# Patient Record
Sex: Male | Born: 1940
Health system: Southern US, Community
[De-identification: ages and names within clinical notes are randomized; demographics above are authoritative.]

## PROBLEM LIST (undated history)

## (undated) DIAGNOSIS — I499 Cardiac arrhythmia, unspecified: Secondary | ICD-10-CM

## (undated) DIAGNOSIS — E7800 Pure hypercholesterolemia, unspecified: Secondary | ICD-10-CM

## (undated) DIAGNOSIS — M1990 Unspecified osteoarthritis, unspecified site: Secondary | ICD-10-CM

## (undated) DIAGNOSIS — K219 Gastro-esophageal reflux disease without esophagitis: Secondary | ICD-10-CM

## (undated) DIAGNOSIS — Z9289 Personal history of other medical treatment: Secondary | ICD-10-CM

## (undated) DIAGNOSIS — I10 Essential (primary) hypertension: Secondary | ICD-10-CM

## (undated) DIAGNOSIS — F32A Depression, unspecified: Secondary | ICD-10-CM

## (undated) DIAGNOSIS — J449 Chronic obstructive pulmonary disease, unspecified: Secondary | ICD-10-CM

## (undated) DIAGNOSIS — K625 Hemorrhage of anus and rectum: Secondary | ICD-10-CM

## (undated) DIAGNOSIS — E785 Hyperlipidemia, unspecified: Secondary | ICD-10-CM

## (undated) DIAGNOSIS — I480 Paroxysmal atrial fibrillation: Secondary | ICD-10-CM

## (undated) DIAGNOSIS — N183 Chronic kidney disease, stage 3 unspecified: Secondary | ICD-10-CM

## (undated) DIAGNOSIS — C61 Malignant neoplasm of prostate: Secondary | ICD-10-CM

## (undated) DIAGNOSIS — I8393 Asymptomatic varicose veins of bilateral lower extremities: Secondary | ICD-10-CM

## (undated) DIAGNOSIS — K5120 Ulcerative (chronic) proctitis without complications: Secondary | ICD-10-CM

## (undated) HISTORY — DX: Malignant neoplasm of prostate: C61

## (undated) HISTORY — PX: PROSTATECTOMY: SHX69

## (undated) HISTORY — DX: Essential (primary) hypertension: I10

## (undated) HISTORY — DX: Hyperlipidemia, unspecified: E78.5

## (undated) HISTORY — DX: Depression, unspecified: F32.A

## (undated) HISTORY — DX: Paroxysmal atrial fibrillation: I48.0

## (undated) HISTORY — DX: Asymptomatic varicose veins of bilateral lower extremities: I83.93

## (undated) HISTORY — DX: Chronic obstructive pulmonary disease, unspecified: J44.9

## (undated) HISTORY — DX: Chronic kidney disease, stage 3 unspecified: N18.30

## (undated) HISTORY — DX: Hemorrhage of anus and rectum: K62.5

## (undated) HISTORY — DX: Ulcerative (chronic) proctitis without complications: K51.20

## (undated) HISTORY — DX: Pure hypercholesterolemia, unspecified: E78.00

## (undated) HISTORY — PX: GASTRECTOMY: SHX58

## (undated) HISTORY — DX: Personal history of other medical treatment: Z92.89

---

## 2001-08-13 ENCOUNTER — Ambulatory Visit (HOSPITAL_COMMUNITY): Admission: RE | Admit: 2001-08-13 | Discharge: 2001-08-13 | Payer: Self-pay | Admitting: Family Medicine

## 2001-08-13 ENCOUNTER — Encounter: Payer: Self-pay | Admitting: Family Medicine

## 2014-11-15 ENCOUNTER — Encounter (INDEPENDENT_AMBULATORY_CARE_PROVIDER_SITE_OTHER): Payer: Self-pay

## 2014-11-15 ENCOUNTER — Encounter (INDEPENDENT_AMBULATORY_CARE_PROVIDER_SITE_OTHER): Payer: Self-pay | Admitting: *Deleted

## 2016-03-08 DIAGNOSIS — I10 Essential (primary) hypertension: Secondary | ICD-10-CM | POA: Diagnosis not present

## 2016-03-08 DIAGNOSIS — Z6835 Body mass index (BMI) 35.0-35.9, adult: Secondary | ICD-10-CM | POA: Diagnosis not present

## 2016-03-08 DIAGNOSIS — Z1389 Encounter for screening for other disorder: Secondary | ICD-10-CM | POA: Diagnosis not present

## 2016-03-08 DIAGNOSIS — F329 Major depressive disorder, single episode, unspecified: Secondary | ICD-10-CM | POA: Diagnosis not present

## 2016-03-08 DIAGNOSIS — Z299 Encounter for prophylactic measures, unspecified: Secondary | ICD-10-CM | POA: Diagnosis not present

## 2016-03-29 DIAGNOSIS — J019 Acute sinusitis, unspecified: Secondary | ICD-10-CM | POA: Diagnosis not present

## 2016-03-29 DIAGNOSIS — I10 Essential (primary) hypertension: Secondary | ICD-10-CM | POA: Diagnosis not present

## 2016-03-29 DIAGNOSIS — F329 Major depressive disorder, single episode, unspecified: Secondary | ICD-10-CM | POA: Diagnosis not present

## 2016-03-29 DIAGNOSIS — Z87891 Personal history of nicotine dependence: Secondary | ICD-10-CM | POA: Diagnosis not present

## 2016-03-29 DIAGNOSIS — J449 Chronic obstructive pulmonary disease, unspecified: Secondary | ICD-10-CM | POA: Diagnosis not present

## 2016-03-29 DIAGNOSIS — Z299 Encounter for prophylactic measures, unspecified: Secondary | ICD-10-CM | POA: Diagnosis not present

## 2016-05-21 DIAGNOSIS — R5383 Other fatigue: Secondary | ICD-10-CM | POA: Diagnosis not present

## 2016-05-21 DIAGNOSIS — Z1211 Encounter for screening for malignant neoplasm of colon: Secondary | ICD-10-CM | POA: Diagnosis not present

## 2016-05-21 DIAGNOSIS — Z7189 Other specified counseling: Secondary | ICD-10-CM | POA: Diagnosis not present

## 2016-05-21 DIAGNOSIS — Z79899 Other long term (current) drug therapy: Secondary | ICD-10-CM | POA: Diagnosis not present

## 2016-05-21 DIAGNOSIS — Z1389 Encounter for screening for other disorder: Secondary | ICD-10-CM | POA: Diagnosis not present

## 2016-05-21 DIAGNOSIS — E7800 Pure hypercholesterolemia, unspecified: Secondary | ICD-10-CM | POA: Diagnosis not present

## 2016-05-21 DIAGNOSIS — Z0000 Encounter for general adult medical examination without abnormal findings: Secondary | ICD-10-CM | POA: Diagnosis not present

## 2016-05-21 DIAGNOSIS — Z125 Encounter for screening for malignant neoplasm of prostate: Secondary | ICD-10-CM | POA: Diagnosis not present

## 2016-05-21 DIAGNOSIS — Z299 Encounter for prophylactic measures, unspecified: Secondary | ICD-10-CM | POA: Diagnosis not present

## 2016-06-07 DIAGNOSIS — Z23 Encounter for immunization: Secondary | ICD-10-CM | POA: Diagnosis not present

## 2016-07-19 DIAGNOSIS — F329 Major depressive disorder, single episode, unspecified: Secondary | ICD-10-CM | POA: Diagnosis not present

## 2016-07-19 DIAGNOSIS — I10 Essential (primary) hypertension: Secondary | ICD-10-CM | POA: Diagnosis not present

## 2016-08-19 DIAGNOSIS — F329 Major depressive disorder, single episode, unspecified: Secondary | ICD-10-CM | POA: Diagnosis not present

## 2016-08-19 DIAGNOSIS — I10 Essential (primary) hypertension: Secondary | ICD-10-CM | POA: Diagnosis not present

## 2016-08-21 DIAGNOSIS — M170 Bilateral primary osteoarthritis of knee: Secondary | ICD-10-CM | POA: Diagnosis not present

## 2016-08-21 DIAGNOSIS — J449 Chronic obstructive pulmonary disease, unspecified: Secondary | ICD-10-CM | POA: Diagnosis not present

## 2016-08-21 DIAGNOSIS — I10 Essential (primary) hypertension: Secondary | ICD-10-CM | POA: Diagnosis not present

## 2016-08-21 DIAGNOSIS — Z6836 Body mass index (BMI) 36.0-36.9, adult: Secondary | ICD-10-CM | POA: Diagnosis not present

## 2016-08-21 DIAGNOSIS — Z299 Encounter for prophylactic measures, unspecified: Secondary | ICD-10-CM | POA: Diagnosis not present

## 2016-10-17 DIAGNOSIS — F329 Major depressive disorder, single episode, unspecified: Secondary | ICD-10-CM | POA: Diagnosis not present

## 2016-10-17 DIAGNOSIS — I10 Essential (primary) hypertension: Secondary | ICD-10-CM | POA: Diagnosis not present

## 2016-11-19 DIAGNOSIS — F329 Major depressive disorder, single episode, unspecified: Secondary | ICD-10-CM | POA: Diagnosis not present

## 2016-11-19 DIAGNOSIS — I10 Essential (primary) hypertension: Secondary | ICD-10-CM | POA: Diagnosis not present

## 2016-11-21 DIAGNOSIS — Z87891 Personal history of nicotine dependence: Secondary | ICD-10-CM | POA: Diagnosis not present

## 2016-11-21 DIAGNOSIS — I10 Essential (primary) hypertension: Secondary | ICD-10-CM | POA: Diagnosis not present

## 2016-11-21 DIAGNOSIS — J449 Chronic obstructive pulmonary disease, unspecified: Secondary | ICD-10-CM | POA: Diagnosis not present

## 2016-11-21 DIAGNOSIS — M25569 Pain in unspecified knee: Secondary | ICD-10-CM | POA: Diagnosis not present

## 2016-11-21 DIAGNOSIS — I480 Paroxysmal atrial fibrillation: Secondary | ICD-10-CM | POA: Diagnosis not present

## 2016-11-21 DIAGNOSIS — Z299 Encounter for prophylactic measures, unspecified: Secondary | ICD-10-CM | POA: Diagnosis not present

## 2016-11-21 DIAGNOSIS — Z713 Dietary counseling and surveillance: Secondary | ICD-10-CM | POA: Diagnosis not present

## 2016-11-21 DIAGNOSIS — Z6836 Body mass index (BMI) 36.0-36.9, adult: Secondary | ICD-10-CM | POA: Diagnosis not present

## 2016-11-29 DIAGNOSIS — H353131 Nonexudative age-related macular degeneration, bilateral, early dry stage: Secondary | ICD-10-CM | POA: Diagnosis not present

## 2016-11-29 DIAGNOSIS — H25812 Combined forms of age-related cataract, left eye: Secondary | ICD-10-CM | POA: Diagnosis not present

## 2016-11-29 DIAGNOSIS — H25811 Combined forms of age-related cataract, right eye: Secondary | ICD-10-CM | POA: Diagnosis not present

## 2016-11-29 DIAGNOSIS — H02831 Dermatochalasis of right upper eyelid: Secondary | ICD-10-CM | POA: Diagnosis not present

## 2016-12-05 DIAGNOSIS — H25812 Combined forms of age-related cataract, left eye: Secondary | ICD-10-CM | POA: Diagnosis not present

## 2016-12-05 DIAGNOSIS — H2512 Age-related nuclear cataract, left eye: Secondary | ICD-10-CM | POA: Diagnosis not present

## 2016-12-26 DIAGNOSIS — H25811 Combined forms of age-related cataract, right eye: Secondary | ICD-10-CM | POA: Diagnosis not present

## 2017-01-20 DIAGNOSIS — I10 Essential (primary) hypertension: Secondary | ICD-10-CM | POA: Diagnosis not present

## 2017-01-20 DIAGNOSIS — F329 Major depressive disorder, single episode, unspecified: Secondary | ICD-10-CM | POA: Diagnosis not present

## 2017-02-18 DIAGNOSIS — F329 Major depressive disorder, single episode, unspecified: Secondary | ICD-10-CM | POA: Diagnosis not present

## 2017-02-18 DIAGNOSIS — I10 Essential (primary) hypertension: Secondary | ICD-10-CM | POA: Diagnosis not present

## 2017-03-21 DIAGNOSIS — Z713 Dietary counseling and surveillance: Secondary | ICD-10-CM | POA: Diagnosis not present

## 2017-03-21 DIAGNOSIS — Z6836 Body mass index (BMI) 36.0-36.9, adult: Secondary | ICD-10-CM | POA: Diagnosis not present

## 2017-03-21 DIAGNOSIS — Z299 Encounter for prophylactic measures, unspecified: Secondary | ICD-10-CM | POA: Diagnosis not present

## 2017-03-21 DIAGNOSIS — K625 Hemorrhage of anus and rectum: Secondary | ICD-10-CM | POA: Diagnosis not present

## 2017-03-27 ENCOUNTER — Encounter (INDEPENDENT_AMBULATORY_CARE_PROVIDER_SITE_OTHER): Payer: Self-pay | Admitting: Internal Medicine

## 2017-04-01 ENCOUNTER — Ambulatory Visit (INDEPENDENT_AMBULATORY_CARE_PROVIDER_SITE_OTHER): Payer: Medicare Other | Admitting: Internal Medicine

## 2017-04-01 ENCOUNTER — Other Ambulatory Visit (INDEPENDENT_AMBULATORY_CARE_PROVIDER_SITE_OTHER): Payer: Self-pay | Admitting: Internal Medicine

## 2017-04-01 ENCOUNTER — Encounter (INDEPENDENT_AMBULATORY_CARE_PROVIDER_SITE_OTHER): Payer: Self-pay | Admitting: *Deleted

## 2017-04-01 ENCOUNTER — Encounter (INDEPENDENT_AMBULATORY_CARE_PROVIDER_SITE_OTHER): Payer: Self-pay | Admitting: Internal Medicine

## 2017-04-01 ENCOUNTER — Encounter (INDEPENDENT_AMBULATORY_CARE_PROVIDER_SITE_OTHER): Payer: Self-pay

## 2017-04-01 ENCOUNTER — Telehealth (INDEPENDENT_AMBULATORY_CARE_PROVIDER_SITE_OTHER): Payer: Self-pay | Admitting: *Deleted

## 2017-04-01 DIAGNOSIS — K625 Hemorrhage of anus and rectum: Secondary | ICD-10-CM

## 2017-04-01 DIAGNOSIS — I10 Essential (primary) hypertension: Secondary | ICD-10-CM

## 2017-04-01 DIAGNOSIS — E7800 Pure hypercholesterolemia, unspecified: Secondary | ICD-10-CM

## 2017-04-01 DIAGNOSIS — K5120 Ulcerative (chronic) proctitis without complications: Secondary | ICD-10-CM

## 2017-04-01 HISTORY — DX: Essential (primary) hypertension: I10

## 2017-04-01 HISTORY — DX: Pure hypercholesterolemia, unspecified: E78.00

## 2017-04-01 HISTORY — DX: Hemorrhage of anus and rectum: K62.5

## 2017-04-01 HISTORY — DX: Ulcerative (chronic) proctitis without complications: K51.20

## 2017-04-01 MED ORDER — PEG 3350-KCL-NA BICARB-NACL 420 G PO SOLR: 4000.0000 mL | Freq: Once | ORAL | 0 refills | Status: AC

## 2017-04-01 NOTE — Telephone Encounter (Signed)
Patient needs trilyte 

## 2017-04-01 NOTE — Progress Notes (Addendum)
Subjective:    Patient ID: Marvin Lawson, male    DOB: Nov 25, 1940, 76 y.o.   MRN: [id number]  HPI  Referred by Dr. Sherryll Burger for rectal bleeding. States he see blood in the commode. Turns the water in the commode red. He states he sees blood every day. He has some pain across his mid abdomen.  His stools are normal. Has a BM x 2 every morning. BMs are dark brown in color and sometimes they are red.  Has had rectal bleeding x 3 weeks. Hx of paroxysmal and maintained on Xarelto Hx of UC.  03/21/2017 Hand H 13.7 and 40.9  Last colonoscopy in 2011 (Surveillance). Hx of UC. Has been in remission for several years No evidence of active colitis. 6-7 mm polyp snared from rectum.Smaol polyp at rectosigmoid junction ablated via cold biopsy and 3 tiny polyps in the rectum were coagulated. A few tiny diverticula at sigmoid and external hemorrhoids. Biopsy Tubular adenoma and Hyperplastic polyp.      Review of Systems    Past Medical History:  Diagnosis Date  . Essential hypertension, benign 04/01/2017  . High cholesterol 04/01/2017  . Rectal bleeding 04/01/2017  . UC (ulcerative colitis confined to rectum) ([facility]) 04/01/2017   Current Outpatient Prescriptions  Medication Sig Dispense Refill  . atorvastatin (LIPITOR) 10 MG tablet Take 10 mg by mouth daily.    . diclofenac sodium (VOLTAREN) 1 % GEL Apply topically 4 (four) times daily.    Marland Kitchen diltiazem (TIAZAC) 180 MG 24 hr capsule Take 180 mg by mouth daily.    Marland Kitchen lisinopril-hydrochlorothiazide (PRINZIDE,ZESTORETIC) 20-12.5 MG tablet Take 1 tablet by mouth daily.    Marland Kitchen loratadine (CLARITIN) 10 MG tablet Take 10 mg by mouth daily.    . mirtazapine (REMERON) 30 MG tablet Take 30 mg by mouth at bedtime.    Marland Kitchen omeprazole (PRILOSEC) 40 MG capsule Take 40 mg by mouth daily.    . rivaroxaban (XARELTO) 20 MG TABS tablet Take 20 mg by mouth daily with supper.     No current facility-administered medications for this visit.     No past surgical history on  file.  No Known Allergies  No current outpatient prescriptions on file prior to visit.   No current facility-administered medications on file prior to visit.          Objective:   Physical Exam Blood pressure (!) 156/62, pulse 72, temperature 97.9 F (36.6 C), height 6\' 2"  (1.88 m), weight 252 lb 12.8 oz (114.7 kg). Alert and oriented. Skin warm and dry. Oral mucosa is moist.   . Sclera anicteric, conjunctivae is pink. Thyroid not enlarged. No cervical lymphadenopathy. Lungs clear. Heart regular rate and rhythm.  Abdomen is soft. Bowel sounds are positive. No hepatomegaly. No abdominal masses felt. No tenderness.  No edema to lower extremities.  Stool brown and guaiac positive         Assessment & Plan:  Rectal bleeding. Colonic neoplasm, polyp, AVM,  ulcer needs to be ruled out.  Colonoscopy. The risks of bleeding, perforation and infection were reviewed with patient.

## 2017-04-01 NOTE — Patient Instructions (Signed)
The risks of bleeding, perforation and infection were reviewed with patient.  

## 2017-04-02 ENCOUNTER — Encounter (HOSPITAL_COMMUNITY): Payer: Self-pay | Admitting: Emergency Medicine

## 2017-04-02 ENCOUNTER — Telehealth (INDEPENDENT_AMBULATORY_CARE_PROVIDER_SITE_OTHER): Payer: Self-pay | Admitting: *Deleted

## 2017-04-02 ENCOUNTER — Emergency Department (HOSPITAL_COMMUNITY)
Admission: EM | Admit: 2017-04-02 | Discharge: 2017-04-02 | Disposition: A | Discharge status: Home / Self Care | Payer: Medicare Other | Attending: Emergency Medicine | Admitting: Emergency Medicine

## 2017-04-02 DIAGNOSIS — K5120 Ulcerative (chronic) proctitis without complications: Secondary | ICD-10-CM | POA: Insufficient documentation

## 2017-04-02 DIAGNOSIS — I10 Essential (primary) hypertension: Secondary | ICD-10-CM | POA: Diagnosis not present

## 2017-04-02 DIAGNOSIS — Z79899 Other long term (current) drug therapy: Secondary | ICD-10-CM | POA: Insufficient documentation

## 2017-04-02 DIAGNOSIS — K625 Hemorrhage of anus and rectum: Secondary | ICD-10-CM | POA: Insufficient documentation

## 2017-04-02 DIAGNOSIS — Z87891 Personal history of nicotine dependence: Secondary | ICD-10-CM | POA: Diagnosis not present

## 2017-04-02 LAB — COMPREHENSIVE METABOLIC PANEL
ALT: 24 U/L (ref 17–63)
AST: 24 U/L (ref 15–41)
Albumin: 3.8 g/dL (ref 3.5–5.0)
Alkaline Phosphatase: 80 U/L (ref 38–126)
Anion gap: 9 (ref 5–15)
BUN: 15 mg/dL (ref 6–20)
CO2: 25 mmol/L (ref 22–32)
Calcium: 9.1 mg/dL (ref 8.9–10.3)
Chloride: 103 mmol/L (ref 101–111)
Creatinine, Ser: 1.28 mg/dL — ABNORMAL HIGH (ref 0.61–1.24)
GFR calc Af Amer: >60 mL/min (ref >60)
GFR calc non Af Amer: 53 mL/min — ABNORMAL LOW (ref >60)
Glucose, Bld: 117 mg/dL — ABNORMAL HIGH (ref 65–99)
Potassium: 4.2 mmol/L (ref 3.5–5.1)
Sodium: 137 mmol/L (ref 135–145)
Total Bilirubin: 0.7 mg/dL (ref 0.3–1.2)
Total Protein: 7.1 g/dL (ref 6.5–8.1)

## 2017-04-02 LAB — CBC
HCT: 38.4 % — ABNORMAL LOW (ref 39.0–52.0)
Hemoglobin: 13.2 g/dL (ref 13.0–17.0)
MCH: 33 pg (ref 26.0–34.0)
MCHC: 34.4 g/dL (ref 30.0–36.0)
MCV: 96 fL (ref 78.0–100.0)
Platelets: 309 10*3/uL (ref 150–400)
RBC: 4 MIL/uL — ABNORMAL LOW (ref 4.22–5.81)
RDW: 12.4 % (ref 11.5–15.5)
WBC: 9.8 10*3/uL (ref 4.0–10.5)

## 2017-04-02 LAB — TYPE AND SCREEN
ABO/RH(D): O POS
Antibody Screen: NEGATIVE

## 2017-04-02 NOTE — Telephone Encounter (Addendum)
Per Otila Kluver with Dr Manuella Ghazi -- it is ok for patient to stop Xarelto 2 days prior to TCS sch'd 05/07/17, patient aware

## 2017-04-02 NOTE — ED Triage Notes (Signed)
Patient states that was seen at his doctors office yesterday and told them that he has been having bowel movements that are "pure blood." He states that it is bight red in color with large clots measuring about 3 to 4 inches. Patient describes left lower abdominal pain. Denies dizziness or fatigue.  Patient notes taking Zarelto for anticoagulation.

## 2017-04-02 NOTE — ED Notes (Signed)
Pt reports rectal bleeding x 1 month - saw his physician last week who drew blood and told his his "counts were fine".   He was seen by Dr Oneida Alar yesterday and scheduled for an endoscopy in August  He reports 6 episodes of "bright red blood" in stools today  He inquires how much blood he can lose. Daughters x 2 and grand child at bedside  IVs x 2 to R arm, specimens to lab- Dr Raliegh Ip has evaluated

## 2017-04-02 NOTE — ED Notes (Signed)
Daughter to desk shouting and belligerent - Shouting that her father had been here long enough and that he was going to leave- repeated several times after the physician said he would be in to discuss with pt- pt daughter continued to shout and security cqalled and escorted her to the waiting room.   Dr Raliegh Ip in to discuss with pt and his other daughter-

## 2017-04-02 NOTE — ED Notes (Signed)
Pt ambulatory to waiting room. Pt verbalized understanding of discharge instructions.   

## 2017-04-02 NOTE — ED Provider Notes (Signed)
[facility] DEPT Provider Note   CSN: [id number] Arrival date & time: 04/02/17  1724  By signing my name below, I, Dora Sims, attest that this documentation has been prepared under the direction and in the presence of physician practitioner, Virgel Manifold, MD. Electronically Signed: Dora Sims, Scribe. 04/02/2017. 6:01 PM.  History   Chief Complaint Chief Complaint  Patient presents with  . Rectal Bleeding   The history is provided by the patient. No language interpreter was used.    HPI Comments: Marvin Lawson is a 76 y.o. male with PMHx including HTN and ulcerative colitis who presents to the Emergency Department complaining of intermittent rectal bleeding for one month, persistent and worsening for two weeks. Patient states that he initially noticed bright red blood on the toilet tissue after wiping, but reports that he has also noticed blood in the toilet bowl over the last two weeks. He denies any hematochezia and notes the blood in the toilet bowl is separate from the stool. Patient has been having more bowel movements than usual over the last couple of weeks. He is also reporting some recent LUQ pain and transient dizziness with position changes. He has a h/o ulcerative colitis with two related surgeries in the distant past. He has also had multiple colonoscopies performed in the past. Patient uses Xarelto for a heart arrhythmia. He denies rectal pain, nausea, vomiting, acute bleeding from his gums, or any other associated symptoms. He is followed by both gastroenterology and primary care.   Past Medical History:  Diagnosis Date  . Essential hypertension, benign 04/01/2017  . High cholesterol 04/01/2017  . Rectal bleeding 04/01/2017  . UC (ulcerative colitis confined to rectum) ([facility]) 04/01/2017    Patient Active Problem List   Diagnosis Date Noted  . Essential hypertension, benign 04/01/2017  . High cholesterol 04/01/2017  . UC (ulcerative colitis confined to rectum)  ([facility]) 04/01/2017  . Rectal bleeding 04/01/2017    History reviewed. No pertinent surgical history.     Home Medications    Prior to Admission medications   Medication Sig Start Date End Date Taking? Authorizing Provider  atorvastatin (LIPITOR) 10 MG tablet Take 10 mg by mouth daily.    [provider]  CARTIA XT 180 MG 24 hr capsule Take 180 mg by mouth daily. 03/28/17   [provider]  diclofenac sodium (VOLTAREN) 1 % GEL Apply topically 4 (four) times daily.    [provider]  diltiazem (TIAZAC) 180 MG 24 hr capsule Take 180 mg by mouth daily.    [provider]  lisinopril-hydrochlorothiazide (PRINZIDE,ZESTORETIC) 20-12.5 MG tablet Take 1 tablet by mouth daily.    [provider]  loratadine (CLARITIN) 10 MG tablet Take 10 mg by mouth daily.    [provider]  mirtazapine (REMERON) 30 MG tablet Take 30 mg by mouth at bedtime.    [provider]  omeprazole (PRILOSEC) 40 MG capsule Take 40 mg by mouth daily.    [provider]  rivaroxaban (XARELTO) 20 MG TABS tablet Take 20 mg by mouth daily with supper.    [provider]    Family History No family history on file.  Social History Social History  Substance Use Topics  . Smoking status: Former Research scientist (life sciences)  . Smokeless tobacco: Never Used  . Alcohol use Yes     Comment: Has not drank 1 month.      Allergies   Patient has no known allergies.   Review of Systems Review of Systems  All other systems reviewed and are negative.  Physical Exam Updated Vital Signs BP (!) 147/65 (BP Location: Left Arm)   Pulse 71   Temp 98.1 F (36.7 C) (Oral)   Resp 18   Ht 6' 2"  (1.88 m)   Wt 252 lb (114.3 kg)   SpO2 97%   BMI 32.35 kg/m   Physical Exam  Constitutional: He appears well-developed and well-nourished.  HENT:  Head: Normocephalic.  Right Ear: External ear normal.  Left Ear: External ear normal.  Nose: Nose normal.  Eyes: Conjunctivae  are normal. Right eye exhibits no discharge. Left eye exhibits no discharge.  Neck: Normal range of motion.  Cardiovascular: Normal rate, regular rhythm and normal heart sounds.   No murmur heard. Pulmonary/Chest: Effort normal and breath sounds normal. No respiratory distress. He has no wheezes. He has no rales.  Abdominal: Soft. There is tenderness. There is no rebound and no guarding.  Mild suprapubic tenderness.  Musculoskeletal: Normal range of motion. He exhibits no edema or tenderness.  Neurological: He is alert. No cranial nerve deficit. Coordination normal.  Skin: Skin is warm and dry. No rash noted. No erythema. No pallor.  Psychiatric: He has a normal mood and affect. His behavior is normal.  Nursing note and vitals reviewed.  ED Treatments / Results  Labs (all labs ordered are listed, but only abnormal results are displayed) Labs Reviewed  COMPREHENSIVE METABOLIC PANEL - Abnormal; Notable for the following:       Result Value   Glucose, Bld 117 (*)    Creatinine, Ser 1.28 (*)    GFR calc non Af Amer 53 (*)    All other components within normal limits  CBC - Abnormal; Notable for the following:    RBC 4.00 (*)    HCT 38.4 (*)    All other components within normal limits  TYPE AND SCREEN    EKG  EKG Interpretation None       Radiology No results found.  Procedures Procedures (including critical care time)  DIAGNOSTIC STUDIES: Oxygen Saturation is 97% on RA, normal by my interpretation.    COORDINATION OF CARE: 5:59 PM Discussed treatment plan with pt at bedside and pt agreed to plan.  Medications Ordered in ED Medications - No data to display   Initial Impression / Assessment and Plan / ED Course  I have reviewed the triage vital signs and the nursing notes.  Pertinent labs & imaging results that were available during my care of the patient were reviewed by me and considered in my medical decision making (see chart for details).    76yM with rectal  bleeding. He is on xarelto which is pause of concern. HD stable though and H/H stable despite weeks of reported bleeding.  Daughter escalating in the ED for reasons not completely clear to me. When attempting to discuss findings/recommendation, he was only interested in me reporting his blood counts and didn't want to discuss further. Said he just needed to leave. Briefly reiterated return precautions and advised GI FU.   Final Clinical Impressions(s) / ED Diagnoses   Final diagnoses:  Rectal bleeding    New Prescriptions New Prescriptions   No medications on file    I personally preformed the services scribed in my presence. The recorded information has been reviewed is accurate. Virgel Manifold, MD.    Virgel Manifold, MD 04/11/17 [phone]

## 2017-04-03 ENCOUNTER — Encounter (INDEPENDENT_AMBULATORY_CARE_PROVIDER_SITE_OTHER): Payer: Self-pay

## 2017-04-03 DIAGNOSIS — K625 Hemorrhage of anus and rectum: Secondary | ICD-10-CM | POA: Diagnosis not present

## 2017-04-23 ENCOUNTER — Encounter (INDEPENDENT_AMBULATORY_CARE_PROVIDER_SITE_OTHER): Payer: Self-pay | Admitting: *Deleted

## 2017-04-23 ENCOUNTER — Encounter (HOSPITAL_COMMUNITY): Payer: Self-pay | Admitting: *Deleted

## 2017-04-23 ENCOUNTER — Encounter (HOSPITAL_COMMUNITY)
Admission: RE | Disposition: A | Discharge status: Home / Self Care | Payer: Self-pay | Source: Ambulatory Visit | Attending: Internal Medicine

## 2017-04-23 ENCOUNTER — Ambulatory Visit (HOSPITAL_COMMUNITY)
Admission: RE | Admit: 2017-04-23 | Discharge: 2017-04-23 | Disposition: A | Discharge status: Home / Self Care | Payer: Medicare Other | Source: Ambulatory Visit | Attending: Internal Medicine | Admitting: Internal Medicine

## 2017-04-23 DIAGNOSIS — D125 Benign neoplasm of sigmoid colon: Secondary | ICD-10-CM | POA: Diagnosis not present

## 2017-04-23 DIAGNOSIS — K625 Hemorrhage of anus and rectum: Secondary | ICD-10-CM | POA: Diagnosis present

## 2017-04-23 DIAGNOSIS — K219 Gastro-esophageal reflux disease without esophagitis: Secondary | ICD-10-CM | POA: Insufficient documentation

## 2017-04-23 DIAGNOSIS — Z79899 Other long term (current) drug therapy: Secondary | ICD-10-CM | POA: Diagnosis not present

## 2017-04-23 DIAGNOSIS — I10 Essential (primary) hypertension: Secondary | ICD-10-CM | POA: Diagnosis not present

## 2017-04-23 DIAGNOSIS — Z87891 Personal history of nicotine dependence: Secondary | ICD-10-CM | POA: Insufficient documentation

## 2017-04-23 DIAGNOSIS — E7800 Pure hypercholesterolemia, unspecified: Secondary | ICD-10-CM | POA: Diagnosis not present

## 2017-04-23 DIAGNOSIS — Z7902 Long term (current) use of antithrombotics/antiplatelets: Secondary | ICD-10-CM | POA: Insufficient documentation

## 2017-04-23 DIAGNOSIS — K648 Other hemorrhoids: Secondary | ICD-10-CM | POA: Diagnosis not present

## 2017-04-23 DIAGNOSIS — K51511 Left sided colitis with rectal bleeding: Secondary | ICD-10-CM | POA: Insufficient documentation

## 2017-04-23 DIAGNOSIS — K621 Rectal polyp: Secondary | ICD-10-CM | POA: Insufficient documentation

## 2017-04-23 DIAGNOSIS — K6389 Other specified diseases of intestine: Secondary | ICD-10-CM | POA: Diagnosis not present

## 2017-04-23 DIAGNOSIS — K633 Ulcer of intestine: Secondary | ICD-10-CM | POA: Diagnosis not present

## 2017-04-23 HISTORY — DX: Cardiac arrhythmia, unspecified: I49.9

## 2017-04-23 HISTORY — PX: COLONOSCOPY: SHX5424

## 2017-04-23 HISTORY — DX: Gastro-esophageal reflux disease without esophagitis: K21.9

## 2017-04-23 SURGERY — COLONOSCOPY
Anesthesia: Moderate Sedation

## 2017-04-23 MED ORDER — SODIUM CHLORIDE 0.9 % IV SOLN
INTRAVENOUS | Status: DC
  Administered 2017-04-23: 07:00:00 via INTRAVENOUS

## 2017-04-23 MED ORDER — MIDAZOLAM HCL 5 MG/5ML IJ SOLN
INTRAMUSCULAR | Status: AC
  Filled 2017-04-23: qty 10

## 2017-04-23 MED ORDER — MEPERIDINE HCL 50 MG/ML IJ SOLN
INTRAMUSCULAR | Status: AC
  Filled 2017-04-23: qty 1

## 2017-04-23 MED ORDER — MIDAZOLAM HCL 5 MG/5ML IJ SOLN
INTRAMUSCULAR | Status: DC | PRN
  Administered 2017-04-23 (×5): 2 mg via INTRAVENOUS

## 2017-04-23 MED ORDER — MEPERIDINE HCL 50 MG/ML IJ SOLN
INTRAMUSCULAR | Status: DC | PRN
  Administered 2017-04-23 (×2): 25 mg via INTRAVENOUS

## 2017-04-23 MED ORDER — STERILE WATER FOR IRRIGATION IR SOLN
Status: DC | PRN
  Administered 2017-04-23: 08:00:00

## 2017-04-23 MED ORDER — MESALAMINE ER 0.375 G PO CP24: 1500.0000 mg | ORAL_CAPSULE | Freq: Every day | ORAL | 11 refills | Status: DC

## 2017-04-23 NOTE — H&P (Signed)
Marvin Lawson is an 76 y.o. male.   Chief Complaint: Patient is here for colonoscopy. HPI: Patient is 76 year old Caucasian male with history of ulcerative colitis and has been in remission for 7 years. Last colonoscopy was in 2011 with removal of 2 small polyps and one was tubular adenoma. He was noted to be in endoscopic remission. He has been on anticoagulant for one year because of atrial fibrillation. He is noted blood in his bowel movements for the last 2 months. It usually is small in amount but 3 weeks ago he passed large amount of blood. He denies constipation abdominal pain anorexia or weight loss. He has been off Xarelto for 2 days. Family history is negative for CRC.  Past Medical History:  Diagnosis Date  . Dysrhythmia   . Essential hypertension, benign 04/01/2017  . GERD (gastroesophageal reflux disease)   . High cholesterol 04/01/2017  . Rectal bleeding 04/01/2017  . UC (ulcerative colitis) In remission.  04/01/2017    Past Surgical History:  Procedure Laterality Date  . GASTRECTOMY     ulcers  . PROSTATECTOMY      History reviewed. No pertinent family history. Social History:  reports that he has quit smoking. He has never used smokeless tobacco. He reports that he drinks alcohol. He reports that he does not use drugs.  Allergies: No Known Allergies  Medications Prior to Admission  Medication Sig Dispense Refill  . atorvastatin (LIPITOR) 10 MG tablet Take 10 mg by mouth daily.    Marland Kitchen CARTIA XT 180 MG 24 hr capsule Take 180 mg by mouth daily.  4  . diclofenac sodium (VOLTAREN) 1 % GEL Apply topically 4 (four) times daily.    Marland Kitchen diltiazem (TIAZAC) 180 MG 24 hr capsule Take 180 mg by mouth daily.    Marland Kitchen lisinopril-hydrochlorothiazide (PRINZIDE,ZESTORETIC) 20-12.5 MG tablet Take 1 tablet by mouth daily.    Marland Kitchen loratadine (CLARITIN) 10 MG tablet Take 10 mg by mouth daily.    . mirtazapine (REMERON) 30 MG tablet Take 30 mg by mouth at bedtime.    Marland Kitchen omeprazole (PRILOSEC) 40 MG  capsule Take 40 mg by mouth daily.    . rivaroxaban (XARELTO) 20 MG TABS tablet Take 20 mg by mouth daily with supper.      No results found for this or any previous visit (from the past 48 hour(s)). No results found.  ROS  Blood pressure (!) 150/62, pulse 72, temperature (!) 97.5 F (36.4 C), temperature source Oral, resp. rate 18, SpO2 98 %. Physical Exam  Constitutional: He appears well-developed and well-nourished.  HENT:  Mouth/Throat: Oropharynx is clear and moist.  Eyes: Conjunctivae are normal. No scleral icterus.  Neck: No thyromegaly present.  Cardiovascular: Normal rate and regular rhythm.   No murmur heard. Respiratory: Effort normal and breath sounds normal.  GI:  Abdomen is symmetrical with upper midline and lower midline scar. Abdomen soft and nontender without organomegaly or masses.  Musculoskeletal: He exhibits no edema.  Lymphadenopathy:    He has no cervical adenopathy.  Neurological: He is alert.  Skin: Skin is warm and dry.     Assessment/Plan Rectal bleeding. History of ulcerative colitis.  Hildred Laser, MD 04/23/2017, 7:30 AM

## 2017-04-23 NOTE — Op Note (Signed)
[facility] Patient Name: Marvin Lawson Procedure Date: 04/23/2017 7:06 AM MRN: [id number] Date of Birth: October 29, 1940 Attending MD: Lionel December , MD CSN: [id number] Age: 76 Admit Type: Outpatient Procedure:                Colonoscopy Indications:              Rectal bleeding, Left-sided chronic ulcerative                            colitis Providers:                Lionel December, MD, Toniann Fail RN, RN, Judee Clara, RN Referring MD:             [name], MD Medicines:                Meperidine 50 mg IV, Midazolam 10 mg IV Complications:            No immediate complications. Estimated Blood Loss:     Estimated blood loss was minimal. Procedure:                Pre-Anesthesia Assessment:                           - Prior to the procedure, a History and Physical                            was performed, and patient medications and                            allergies were reviewed. The patient's tolerance of                            previous anesthesia was also reviewed. The risks                            and benefits of the procedure and the sedation                            options and risks were discussed with the patient.                            All questions were answered, and informed consent                            was obtained. Prior Anticoagulants: The patient                            last took Xarelto (rivaroxaban) 2 days prior to the                            procedure. ASA Grade Assessment: II - A patient  with mild systemic disease. After reviewing the                            risks and benefits, the patient was deemed in                            satisfactory condition to undergo the procedure.                           After obtaining informed consent, the colonoscope                            was passed under direct vision. Throughout the                            procedure, the  patient's blood pressure, pulse, and                            oxygen saturations were monitored continuously. The                            EC-[id number] ([id number]) scope was introduced through                            the anus and advanced to the the cecum, identified                            by appendiceal orifice and ileocecal valve. The                            colonoscopy was performed without difficulty. The                            patient tolerated the procedure well. The quality                            of the bowel preparation was good. The ileocecal                            valve, appendiceal orifice, and rectum were                            photographed. Scope In: 7:46:31 AM Scope Out: 8:08:58 AM Scope Withdrawal Time: 0 hours 12 minutes 34 seconds  Total Procedure Duration: 0 hours 22 minutes 27 seconds  Findings:      The perianal and digital rectal examinations were normal.      The proximal sigmoid colon, descending colon, splenic flexure,       transverse colon, hepatic flexure, ascending colon, cecum, appendiceal       orifice and ileocecal valve appeared normal.      A 6 mm polyp was found in the mid sigmoid colon. The polyp was       semi-pedunculated. The polyp was removed with a hot snare. Resection and       retrieval were  complete. The pathology specimen was placed into Bottle       Number 1.      A diffuse area of moderately congested, erythematous, eroded and       ulcerated mucosa was found in the distal sigmoid colon. Biopsies were       taken with a cold forceps for histology. The pathology specimen was       placed into Bottle Number 2.      A 4 mm polyp was found in the rectum. The polyp was sessile. The polyp       was removed with a cold snare. Resection and retrieval were complete.       The pathology specimen was placed into Bottle Number 1.      Internal hemorrhoids were found during retroflexion. The hemorrhoids       were  small. Impression:               - The proximal sigmoid colon, descending colon,                            splenic flexure, transverse colon, hepatic flexure,                            ascending colon, cecum, appendiceal orifice and                            ileocecal valve are normal.                           - One 6 mm polyp in the mid sigmoid colon, removed                            with a hot snare. Resected and retrieved.                           - Congested, erythematous, eroded and ulcerated                            mucosa in the distal sigmoid colon. Biopsied.                           - One 4 mm polyp in the rectum, removed with a cold                            snare. Resected and retrieved.                           - Internal hemorrhoids. Moderate Sedation:      Moderate (conscious) sedation was administered by the endoscopy nurse       and supervised by the endoscopist. The following parameters were       monitored: oxygen saturation, heart rate, blood pressure, CO2       capnography and response to care. Total physician intraservice time was       32 minutes. Recommendation:           - Patient has a contact number available for  emergencies. The signs and symptoms of potential                            delayed complications were discussed with the                            patient. Return to normal activities tomorrow.                            Written discharge instructions were provided to the                            patient.                           - Resume previous diet today.                           - Continue present medications.                           - Resume Xarelto (rivaroxaban) at prior dose in 4                            days.                           - Await pathology results.                           - Repeat colonoscopy for surveillance based on                            pathology results.                            - Apriso 1500 mg by mouth daily.                           - Return to GI clinic in 3 months. Procedure Code(s):        --- Professional ---                           [phone], Colonoscopy, flexible; with removal of                            tumor(s), polyp(s), or other lesion(s) by snare                            technique                           45380, 59, Colonoscopy, flexible; with biopsy,                            single or multiple  28413, Moderate sedation services provided by the                            same physician or other qualified health care                            professional performing the diagnostic or                            therapeutic service that the sedation supports,                            requiring the presence of an independent trained                            observer to assist in the monitoring of the                            patient's level of consciousness and physiological                            status; initial 15 minutes of intraservice time,                            patient age 55 years or older                           [phone], Moderate sedation services; each additional                            15 minutes intraservice time Diagnosis Code(s):        --- Professional ---                           K64.8, Other hemorrhoids                           K51.511, Left sided colitis with rectal bleeding                           D12.5, Benign neoplasm of sigmoid colon                           K62.1, Rectal polyp                           K63.89, Other specified diseases of intestine                           K63.3, Ulcer of intestine                           K62.5, Hemorrhage of anus and rectum CPT copyright 2016 [employer]. All rights reserved. The codes documented in this report are preliminary and upon coder review may  be revised to meet current compliance requirements. [employer],  MD Lionel December, MD 04/23/2017 8:25:22 AM This report has been signed electronically. Number of Addenda: 0

## 2017-04-23 NOTE — Discharge Instructions (Signed)
Resume Xarelto on 04/27/2017. Resume other medications and diet as before. Apriso 1500 mg by mouth daily. No driving for 24 hours. Physician will call with biopsy results per

## 2017-05-01 ENCOUNTER — Encounter (INDEPENDENT_AMBULATORY_CARE_PROVIDER_SITE_OTHER): Payer: Self-pay | Admitting: Internal Medicine

## 2017-05-01 NOTE — Progress Notes (Signed)
An appointment for 06/23/17 at 11:30am with Deberah Castle, NP was given.  A letter was mailed to the patient.

## 2017-05-05 ENCOUNTER — Encounter (HOSPITAL_COMMUNITY): Payer: Self-pay | Admitting: Internal Medicine

## 2017-05-27 DIAGNOSIS — Z23 Encounter for immunization: Secondary | ICD-10-CM | POA: Diagnosis not present

## 2017-06-23 ENCOUNTER — Ambulatory Visit (INDEPENDENT_AMBULATORY_CARE_PROVIDER_SITE_OTHER): Payer: Medicare Other | Admitting: Internal Medicine

## 2017-06-23 ENCOUNTER — Encounter (INDEPENDENT_AMBULATORY_CARE_PROVIDER_SITE_OTHER): Payer: Self-pay | Admitting: Internal Medicine

## 2017-06-23 ENCOUNTER — Encounter (INDEPENDENT_AMBULATORY_CARE_PROVIDER_SITE_OTHER): Payer: Self-pay

## 2017-06-23 VITALS — BP 120/80 | HR 64 | Temp 97.8°F | Ht 72.0 in | Wt 246.0 lb

## 2017-06-23 DIAGNOSIS — K5120 Ulcerative (chronic) proctitis without complications: Secondary | ICD-10-CM | POA: Diagnosis not present

## 2017-06-23 LAB — CBC WITH DIFFERENTIAL/PLATELET
BASOS PCT: 0.8 %
Basophils Absolute: 80 cells/uL (ref 0–200)
EOS ABS: 390 {cells}/uL (ref 15–500)
Eosinophils Relative: 3.9 %
HEMATOCRIT: 36.7 % — AB (ref 38.5–50.0)
Hemoglobin: 12.6 g/dL — ABNORMAL LOW (ref 13.2–17.1)
LYMPHS ABS: 2600 {cells}/uL (ref 850–3900)
MCH: 32.2 pg (ref 27.0–33.0)
MCHC: 34.3 g/dL (ref 32.0–36.0)
MCV: 93.9 fL (ref 80.0–100.0)
MPV: 10.1 fL (ref 7.5–12.5)
Monocytes Relative: 9.7 %
NEUTROS PCT: 59.6 %
Neutro Abs: 5960 cells/uL (ref 1500–7800)
PLATELETS: 331 10*3/uL (ref 140–400)
RBC: 3.91 10*6/uL — ABNORMAL LOW (ref 4.20–5.80)
RDW: 11.9 % (ref 11.0–15.0)
TOTAL LYMPHOCYTE: 26 %
WBC: 10 10*3/uL (ref 3.8–10.8)
WBCMIX: 970 {cells}/uL — AB (ref 200–950)

## 2017-06-23 LAB — SEDIMENTATION RATE: Sed Rate: 9 mm/h (ref 0–20)

## 2017-06-23 NOTE — Progress Notes (Addendum)
Subjective:    Patient ID: Marvin Lawson, male    DOB: November 17, 1940, 76 y.o.   MRN: [id number]  HPI Here today for f/u. Underwent a colonoscopy in August for rectal bleeding. Impression:               - The proximal sigmoid colon, descending colon,                            splenic flexure, transverse colon, hepatic flexure,                            ascending colon, cecum, appendiceal orifice and                            ileocecal valve are normal.                           - One 6 mm polyp in the mid sigmoid colon, removed                            with a hot snare. Resected and retrieved.                           - Congested, erythematous, eroded and ulcerated                            mucosa in the distal sigmoid colon. Biopsied.                           - One 4 mm polyp in the rectum, removed with a cold                            snare. Resected and retrieved.                           - Internal hemorrhoids. Biopsy revealed active UC. One polyp adenoma and one is hypoplastic.  He tells me the Judd Lien is helping. He still sees some blood at time. He denies any rectal pain. He usually has a BM daily. No melena. Appetite is good. No weight loss.  No NSAIDS  Hx of paroxysmal and maintained on Xarelto.   Review of Systems   Past Medical History:  Diagnosis Date  . Dysrhythmia   . Essential hypertension, benign 04/01/2017  . GERD (gastroesophageal reflux disease)   . High cholesterol 04/01/2017  . Rectal bleeding 04/01/2017  . UC (ulcerative colitis confined to rectum) (HCC) 04/01/2017    Past Surgical History:  Procedure Laterality Date  . COLONOSCOPY N/A 04/23/2017   Procedure: COLONOSCOPY;  Surgeon: [name], MD;  Location: [facility];  Service: Endoscopy;  Laterality: N/A;  7:30  . GASTRECTOMY     ulcers  . PROSTATECTOMY      No Known Allergies  Current Outpatient Prescriptions on File Prior to Visit  Medication Sig Dispense Refill  .  atorvastatin (LIPITOR) 10 MG tablet Take 10 mg by mouth daily.    . diclofenac sodium (VOLTAREN) 1 % GEL Apply topically 4 (four) times daily.    Marland Kitchen  lisinopril-hydrochlorothiazide (PRINZIDE,ZESTORETIC) 20-12.5 MG tablet Take 1 tablet by mouth daily.    Marland Kitchen loratadine (CLARITIN) 10 MG tablet Take 10 mg by mouth daily.    . mesalamine (APRISO) 0.375 g 24 hr capsule Take 4 capsules (1.5 g total) by mouth daily. 120 capsule 11  . mirtazapine (REMERON) 30 MG tablet Take 30 mg by mouth at bedtime.    Marland Kitchen omeprazole (PRILOSEC) 40 MG capsule Take 40 mg by mouth daily.    . rivaroxaban (XARELTO) 20 MG TABS tablet Take 1 tablet (20 mg total) by mouth daily with supper. 30 tablet    No current facility-administered medications on file prior to visit.         Objective:   Physical Exam Blood pressure 120/80, pulse 64, temperature 97.8 F (36.6 C), height 6' (1.829 m), weight 246 lb (111.6 kg). Alert and oriented. Skin warm and dry. Oral mucosa is moist.   . Sclera anicteric, conjunctivae is pink. Thyroid not enlarged. No cervical lymphadenopathy. Lungs clear. Heart regular rate and rhythm.  Abdomen is soft. Bowel sounds are positive. No hepatomegaly. No abdominal masses felt. No tenderness.  No edema to lower extremities.           Assessment & Plan:  UC he seems to be doing well.  Will get a CBC and sedratetoday. OV in 6 months.

## 2017-06-23 NOTE — Patient Instructions (Signed)
Continue the Apriso.

## 2017-07-29 ENCOUNTER — Ambulatory Visit (INDEPENDENT_AMBULATORY_CARE_PROVIDER_SITE_OTHER): Payer: Medicare Other | Admitting: Internal Medicine

## 2017-12-22 ENCOUNTER — Ambulatory Visit (INDEPENDENT_AMBULATORY_CARE_PROVIDER_SITE_OTHER): Payer: Medicare Other | Admitting: Internal Medicine

## 2017-12-22 ENCOUNTER — Encounter (INDEPENDENT_AMBULATORY_CARE_PROVIDER_SITE_OTHER): Payer: Self-pay | Admitting: Internal Medicine

## 2017-12-22 VITALS — BP 146/70 | HR 72 | Temp 97.6°F | Ht 73.0 in | Wt 248.5 lb

## 2017-12-22 DIAGNOSIS — K5120 Ulcerative (chronic) proctitis without complications: Secondary | ICD-10-CM | POA: Diagnosis not present

## 2017-12-22 MED ORDER — MESALAMINE 1000 MG RE SUPP: 1000.0000 mg | Freq: Every day | RECTAL | 1 refills | Status: DC

## 2017-12-22 NOTE — Patient Instructions (Signed)
UC. CBC and CRP today. Rx for Canasa supp x 30 days. OV in 6 months.

## 2017-12-22 NOTE — Progress Notes (Signed)
Subjective:    Patient ID: Marvin Lawson, male    DOB: 10-30-1940, 77 y.o.   MRN: [id number]  HPI Here today for f/u. Underwent a colonoscopy 04/23/2017 for rectal bleeding.  Impression: The proximal sigmoid colon,descending colon,  splenic flexure, transverse colon, hepatic flexure,  ascending colon, cecum, appendiceal orifice and  ileocecal valve are normal.  One 6 mm polyp in the mid sigmoid colon, removed with a hot snare. Resected and retrieved. Congested, erythematous, eroded and ulcerated  mucosa in the distal sigmoid colon. Biopsied. - One 4 mm polyp in the rectum, removed with a cold snare. Resected and retrieved. Internal hemorrhoids. Biopsy revealed active UC. One polyp adenoma and one is hypoplastic.  Maintained on Apriso for his UC. He says he still see occasionally blood in the commode. He is having a BM x 2 a day.Appetite is good. He has lost about 5 pounds since his visit in July. There is no abdominal pain. He does have some gas.    Colonoscopy in 2011 (Surveillance). Hx of UC. Has been in remission for several years No evidence of active colitis. 6-7 mm polyp snared from rectum.Smaol polyp at rectosigmoid junction ablated via cold biopsy and 3 tiny polyps in the rectum were coagulated. A few tiny diverticula at sigmoid and external hemorrhoids. Biopsy Tubular adenoma and Hyperplastic polyp.   Retired from [employer].  CBC    Component Value Date/Time   WBC 10.0 06/23/2017 1150   RBC 3.91 (L) 06/23/2017 1150   HGB 12.6 (L) 06/23/2017 1150   HCT 36.7 (L) 06/23/2017 1150   PLT 331 06/23/2017 1150   MCV 93.9 06/23/2017 1150   MCH 32.2 06/23/2017 1150   MCHC 34.3 06/23/2017 1150   RDW 11.9 06/23/2017 1150   LYMPHSABS 2,600 06/23/2017 1150   EOSABS 390 06/23/2017 1150   BASOSABS 80 06/23/2017 1150   Erythrocyte Sedimentation Rate     Component Value Date/Time   ESRSEDRATE 9 06/23/2017 1150         Review of  Systems Past Medical History:  Diagnosis Date  . Dysrhythmia   . Essential hypertension, benign 04/01/2017  . GERD (gastroesophageal reflux disease)   . High cholesterol 04/01/2017  . Rectal bleeding 04/01/2017  . UC (ulcerative colitis confined to rectum) (HCC) 04/01/2017    Past Surgical History:  Procedure Laterality Date  . COLONOSCOPY N/A 04/23/2017   Procedure: COLONOSCOPY;  Surgeon: [name], MD;  Location: [facility];  Service: Endoscopy;  Laterality: N/A;  7:30  . GASTRECTOMY     ulcers  . PROSTATECTOMY      No Known Allergies  Current Outpatient Medications on File Prior to Visit  Medication Sig Dispense Refill  . atorvastatin (LIPITOR) 10 MG tablet Take 10 mg by mouth daily.    . diclofenac sodium (VOLTAREN) 1 % GEL Apply topically 4 (four) times daily.    Marland Kitchen diltiazem (CARDIZEM CD) 180 MG 24 hr capsule Take 180 mg by mouth daily.    Marland Kitchen lisinopril-hydrochlorothiazide (PRINZIDE,ZESTORETIC) 20-12.5 MG tablet Take 1 tablet by mouth daily.    Marland Kitchen loratadine (CLARITIN) 10 MG tablet Take 10 mg by mouth daily.    . mesalamine (APRISO) 0.375 g 24 hr capsule Take 4 capsules (1.5 g total) by mouth daily. 120 capsule 11  . mirtazapine (REMERON) 30 MG tablet Take 30 mg by mouth at bedtime.    Marland Kitchen omeprazole (PRILOSEC) 40 MG capsule Take 40 mg by mouth daily.    . rivaroxaban (XARELTO) 20 MG TABS  tablet Take 1 tablet (20 mg total) by mouth daily with supper. 30 tablet    No current facility-administered medications on file prior to visit.         Objective:   Physical Exam Blood pressure (!) 146/70, pulse 72, temperature 97.6 F (36.4 C), height 6\' 1"  (1.854 m), weight 248 lb 8 oz (112.7 kg). Alert and oriented. Skin warm and dry. Oral mucosa is moist.   . Sclera anicteric, conjunctivae is pink. Thyroid not enlarged. No cervical lymphadenopathy. Lungs clear. Heart regular rate and rhythm.  Abdomen is soft. Bowel sounds are positive. No hepatomegaly. No abdominal masses felt. No  tenderness.  No edema to lower extremities.              Assessment & Plan:  UC. He continues to have some rectal bleeding.  Will get a CBC and CRP. OV in 6 months.  Rx for Canasa supp sent to his pharmacy.

## 2017-12-23 LAB — CBC WITH DIFFERENTIAL/PLATELET
BASOS PCT: 0.9 %
Basophils Absolute: 79 cells/uL (ref 0–200)
EOS ABS: 255 {cells}/uL (ref 15–500)
Eosinophils Relative: 2.9 %
HCT: 37.4 % — ABNORMAL LOW (ref 38.5–50.0)
Hemoglobin: 12.8 g/dL — ABNORMAL LOW (ref 13.2–17.1)
Lymphs Abs: 2279 cells/uL (ref 850–3900)
MCH: 31 pg (ref 27.0–33.0)
MCHC: 34.2 g/dL (ref 32.0–36.0)
MCV: 90.6 fL (ref 80.0–100.0)
MONOS PCT: 9.5 %
MPV: 10.2 fL (ref 7.5–12.5)
Neutro Abs: 5350 cells/uL (ref 1500–7800)
Neutrophils Relative %: 60.8 %
PLATELETS: 388 10*3/uL (ref 140–400)
RBC: 4.13 10*6/uL — ABNORMAL LOW (ref 4.20–5.80)
RDW: 12.5 % (ref 11.0–15.0)
TOTAL LYMPHOCYTE: 25.9 %
WBC mixed population: 836 cells/uL (ref 200–950)
WBC: 8.8 10*3/uL (ref 3.8–10.8)

## 2017-12-23 LAB — C-REACTIVE PROTEIN: CRP: 3.4 mg/L (ref <8.0)

## 2018-02-13 ENCOUNTER — Other Ambulatory Visit (INDEPENDENT_AMBULATORY_CARE_PROVIDER_SITE_OTHER): Payer: Self-pay | Admitting: Internal Medicine

## 2018-02-13 DIAGNOSIS — K5120 Ulcerative (chronic) proctitis without complications: Secondary | ICD-10-CM

## 2018-04-05 ENCOUNTER — Other Ambulatory Visit (INDEPENDENT_AMBULATORY_CARE_PROVIDER_SITE_OTHER): Payer: Self-pay | Admitting: Internal Medicine

## 2018-05-05 ENCOUNTER — Other Ambulatory Visit (INDEPENDENT_AMBULATORY_CARE_PROVIDER_SITE_OTHER): Payer: Self-pay | Admitting: Internal Medicine

## 2018-05-05 DIAGNOSIS — K5120 Ulcerative (chronic) proctitis without complications: Secondary | ICD-10-CM

## 2018-06-23 ENCOUNTER — Encounter (INDEPENDENT_AMBULATORY_CARE_PROVIDER_SITE_OTHER): Payer: Self-pay | Admitting: Internal Medicine

## 2018-06-23 ENCOUNTER — Ambulatory Visit (INDEPENDENT_AMBULATORY_CARE_PROVIDER_SITE_OTHER): Payer: Medicare Other | Admitting: Internal Medicine

## 2018-07-07 ENCOUNTER — Other Ambulatory Visit (INDEPENDENT_AMBULATORY_CARE_PROVIDER_SITE_OTHER): Payer: Self-pay | Admitting: Internal Medicine

## 2018-07-07 DIAGNOSIS — K5120 Ulcerative (chronic) proctitis without complications: Secondary | ICD-10-CM

## 2018-09-01 ENCOUNTER — Other Ambulatory Visit (INDEPENDENT_AMBULATORY_CARE_PROVIDER_SITE_OTHER): Payer: Self-pay | Admitting: Internal Medicine

## 2018-09-01 DIAGNOSIS — K5120 Ulcerative (chronic) proctitis without complications: Secondary | ICD-10-CM

## 2018-11-01 ENCOUNTER — Other Ambulatory Visit (INDEPENDENT_AMBULATORY_CARE_PROVIDER_SITE_OTHER): Payer: Self-pay | Admitting: Internal Medicine

## 2018-11-01 DIAGNOSIS — K5120 Ulcerative (chronic) proctitis without complications: Secondary | ICD-10-CM

## 2018-11-18 ENCOUNTER — Ambulatory Visit (INDEPENDENT_AMBULATORY_CARE_PROVIDER_SITE_OTHER): Payer: Medicare Other | Admitting: Internal Medicine

## 2018-11-18 ENCOUNTER — Encounter (INDEPENDENT_AMBULATORY_CARE_PROVIDER_SITE_OTHER): Payer: Self-pay | Admitting: Internal Medicine

## 2018-11-18 VITALS — BP 101/55 | HR 81 | Temp 98.1°F | Ht 74.0 in | Wt 251.1 lb

## 2018-11-18 DIAGNOSIS — K51211 Ulcerative (chronic) proctitis with rectal bleeding: Secondary | ICD-10-CM | POA: Diagnosis not present

## 2018-11-18 MED ORDER — MESALAMINE 1000 MG RE SUPP: RECTAL | 1 refills | Status: DC

## 2018-11-18 NOTE — Progress Notes (Addendum)
Subjective:    Patient ID: Marvin Lawson, male    DOB: Dec 06, 1940, 78 y.o.   MRN: [id number]  HPI Presents today with c/o rectal bleeding. States he has been having rectal bleeding x 3-4 yrs. Sometimes it will turn the water red. Had a small amt of bleediing today. Hx of UC. Maintained on Apriso. Has used Canasa supp as needed for a flare. His appetite is good. No weight loss.    His last colonoscopy was in August of 2018 (rectal bleeding, Left sided chronic UC).  Impression:               - The proximal sigmoid colon, descending colon,                            splenic flexure, transverse colon, hepatic flexure,                            ascending colon, cecum, appendiceal orifice and                            ileocecal valve are normal.                           - One 6 mm polyp in the mid sigmoid colon, removed                            with a hot snare. Resected and retrieved.                           - Congested, erythematous, eroded and ulcerated                            mucosa in the distal sigmoid colon. Biopsied.                           - One 4 mm polyp in the rectum, removed with a cold                            snare. Resected and retrieved.                           - Internal hemorrhoids.  Hx of atrial fib and maintained on Xarelto.   Review of Systems Past Medical History:  Diagnosis Date  . Dysrhythmia   . Essential hypertension, benign 04/01/2017  . GERD (gastroesophageal reflux disease)   . High cholesterol 04/01/2017  . Rectal bleeding 04/01/2017  . UC (ulcerative colitis confined to rectum) (HCC) 04/01/2017    Past Surgical History:  Procedure Laterality Date  . COLONOSCOPY N/A 04/23/2017   Procedure: COLONOSCOPY;  Surgeon: [name], MD;  Location: [facility];  Service: Endoscopy;  Laterality: N/A;  7:30  . GASTRECTOMY     ulcers  . PROSTATECTOMY      No Known Allergies  Current Outpatient Medications on File Prior to Visit    Medication Sig Dispense Refill  . APRISO 0.375 g 24 hr capsule TAKE FOUR CAPSULES BY MOUTH DAILY 120 capsule 11  . atorvastatin (LIPITOR) 10  MG tablet Take 10 mg by mouth daily.    . diclofenac sodium (VOLTAREN) 1 % GEL Apply topically 4 (four) times daily.    Marland Kitchen diltiazem (CARDIZEM CD) 180 MG 24 hr capsule Take 180 mg by mouth daily.    Marland Kitchen lisinopril-hydrochlorothiazide (PRINZIDE,ZESTORETIC) 20-12.5 MG tablet Take 1 tablet by mouth daily.    Marland Kitchen loratadine (CLARITIN) 10 MG tablet Take 10 mg by mouth daily.    . mirtazapine (REMERON) 30 MG tablet Take 30 mg by mouth at bedtime.    Marland Kitchen omeprazole (PRILOSEC) 40 MG capsule Take 40 mg by mouth daily.    . rivaroxaban (XARELTO) 20 MG TABS tablet Take 1 tablet (20 mg total) by mouth daily with supper. 30 tablet   . mesalamine (CANASA) 1000 MG suppository INSERT ONE SUPPOSITORY RECTALLY AT BEDTIME 30 suppository 1   No current facility-administered medications on file prior to visit.         Objective:   Physical Exam Blood pressure (!) 101/55, pulse 81, temperature 98.1 F (36.7 C), height 6\' 2"  (1.88 m), weight 251 lb 1.6 oz (113.9 kg). Alert and oriented. Skin warm and dry. Oral mucosa is moist.   . Sclera anicteric, conjunctivae is pink. Thyroid not enlarged. No cervical lymphadenopathy. Lungs clear. Heart regular rate and rhythm.  Abdomen is soft. Bowel sounds are positive. No hepatomegaly. No abdominal masses felt. No tenderness.  No edema to lower extremities.          Assessment & Plan:  UC . Am going to get a CRP and CBC., Am going to start him on Canasa supp x 1 month, He will call me in a couple of weeks and let me know how he does. If not better, will start Hydrocortisone enemas.

## 2018-11-18 NOTE — Patient Instructions (Signed)
Rx for Canasa supp x 1 month.  PR in 4 weeks.  CBC and CRP.

## 2018-11-19 LAB — CBC WITH DIFFERENTIAL/PLATELET
Absolute Monocytes: 1307 cells/uL — ABNORMAL HIGH (ref 200–950)
BASOS ABS: 97 {cells}/uL (ref 0–200)
Basophils Relative: 0.9 %
EOS PCT: 2.7 %
Eosinophils Absolute: 292 cells/uL (ref 15–500)
HCT: 23.6 % — ABNORMAL LOW (ref 38.5–50.0)
HEMOGLOBIN: 7.3 g/dL — AB (ref 13.2–17.1)
Lymphs Abs: 1642 cells/uL (ref 850–3900)
MCH: 24 pg — ABNORMAL LOW (ref 27.0–33.0)
MCHC: 30.9 g/dL — AB (ref 32.0–36.0)
MCV: 77.6 fL — ABNORMAL LOW (ref 80.0–100.0)
MONOS PCT: 12.1 %
MPV: 9.8 fL (ref 7.5–12.5)
NEUTROS ABS: 7463 {cells}/uL (ref 1500–7800)
Neutrophils Relative %: 69.1 %
PLATELETS: 551 10*3/uL — AB (ref 140–400)
RBC: 3.04 10*6/uL — ABNORMAL LOW (ref 4.20–5.80)
RDW: 14.5 % (ref 11.0–15.0)
TOTAL LYMPHOCYTE: 15.2 %
WBC: 10.8 10*3/uL (ref 3.8–10.8)

## 2018-11-19 LAB — C-REACTIVE PROTEIN: CRP: 3 mg/L (ref <8.0)

## 2018-11-25 ENCOUNTER — Encounter (HOSPITAL_COMMUNITY)
Admission: RE | Admit: 2018-11-25 | Discharge: 2018-11-25 | Disposition: A | Discharge status: Home / Self Care | Payer: Medicare Other | Source: Ambulatory Visit | Attending: Internal Medicine | Admitting: Internal Medicine

## 2018-11-25 ENCOUNTER — Encounter (HOSPITAL_COMMUNITY): Payer: Self-pay

## 2018-11-25 DIAGNOSIS — K51211 Ulcerative (chronic) proctitis with rectal bleeding: Secondary | ICD-10-CM | POA: Insufficient documentation

## 2018-11-25 DIAGNOSIS — D509 Iron deficiency anemia, unspecified: Secondary | ICD-10-CM | POA: Diagnosis not present

## 2018-11-25 DIAGNOSIS — K625 Hemorrhage of anus and rectum: Secondary | ICD-10-CM | POA: Diagnosis not present

## 2018-11-25 MED ORDER — SODIUM CHLORIDE 0.9 % IV SOLN
Freq: Once | INTRAVENOUS | Status: AC
  Administered 2018-11-25: 11:00:00 via INTRAVENOUS

## 2018-11-25 MED ORDER — SODIUM CHLORIDE 0.9 % IV SOLN
510.0000 mg | Freq: Once | INTRAVENOUS | Status: AC
  Administered 2018-11-25: 510 mg via INTRAVENOUS
  Filled 2018-11-25: qty 17

## 2018-11-25 NOTE — Discharge Instructions (Signed)

## 2018-11-25 NOTE — Progress Notes (Signed)
Teaching completed regarding Feraheme.  Written material provided.  Verbalized understanding.

## 2018-12-02 ENCOUNTER — Other Ambulatory Visit: Payer: Self-pay

## 2018-12-02 ENCOUNTER — Encounter (HOSPITAL_COMMUNITY)
Admission: RE | Admit: 2018-12-02 | Discharge: 2018-12-02 | Disposition: A | Discharge status: Home / Self Care | Payer: Medicare Other | Source: Ambulatory Visit | Attending: Internal Medicine | Admitting: Internal Medicine

## 2018-12-02 DIAGNOSIS — K51211 Ulcerative (chronic) proctitis with rectal bleeding: Secondary | ICD-10-CM | POA: Diagnosis not present

## 2018-12-02 LAB — HEMOGLOBIN AND HEMATOCRIT, BLOOD
HCT: 26.6 % — ABNORMAL LOW (ref 39.0–52.0)
HEMOGLOBIN: 7.4 g/dL — AB (ref 13.0–17.0)

## 2018-12-02 MED ORDER — SODIUM CHLORIDE 0.9 % IV SOLN
510.0000 mg | Freq: Once | INTRAVENOUS | Status: AC
  Administered 2018-12-02: 510 mg via INTRAVENOUS
  Filled 2018-12-02: qty 17

## 2018-12-02 MED ORDER — SODIUM CHLORIDE 0.9 % IV SOLN
Freq: Once | INTRAVENOUS | Status: AC
  Administered 2018-12-02: 11:00:00 via INTRAVENOUS

## 2018-12-03 ENCOUNTER — Other Ambulatory Visit (INDEPENDENT_AMBULATORY_CARE_PROVIDER_SITE_OTHER): Payer: Self-pay | Admitting: *Deleted

## 2018-12-03 DIAGNOSIS — K51811 Other ulcerative colitis with rectal bleeding: Secondary | ICD-10-CM

## 2018-12-03 DIAGNOSIS — K625 Hemorrhage of anus and rectum: Secondary | ICD-10-CM

## 2019-05-05 ENCOUNTER — Ambulatory Visit (INDEPENDENT_AMBULATORY_CARE_PROVIDER_SITE_OTHER): Payer: Medicare Other | Admitting: Nurse Practitioner

## 2019-05-05 ENCOUNTER — Encounter (INDEPENDENT_AMBULATORY_CARE_PROVIDER_SITE_OTHER): Payer: Self-pay | Admitting: Nurse Practitioner

## 2019-05-05 ENCOUNTER — Other Ambulatory Visit: Payer: Self-pay

## 2019-05-05 VITALS — BP 138/66 | HR 79 | Temp 98.6°F | Resp 18 | Ht 74.0 in | Wt 252.5 lb

## 2019-05-05 DIAGNOSIS — K51811 Other ulcerative colitis with rectal bleeding: Secondary | ICD-10-CM | POA: Diagnosis not present

## 2019-05-05 NOTE — Progress Notes (Signed)
Subjective:    Patient ID: Marvin Lawson, male    DOB: 12/07/1940, 78 y.o.   MRN: [id number]  HPI  Marvin Lawson is a pleasant 78 y.o. male with a past medical history of atrial fibrillation on Xarelto, GERD, gastric ulcers which required surgery, ? partial gastrectomy, ? prostate cancer s/p prostatectomy and ulcerative colitis diagnosed at the age of 14. He was last seen in our office by Dorene Ar NP on 11/18/2018 with complaints of frequent rectal bleeding for a few years. He was prescribed Canasas 1 gm suppositories and remained on Apriso 0.375mg  2 tab bid. His rectal bleeding did not improve after taking Canasa. No mucous per the rectum. No tenesmus. He was seen by his PCP about 2 weeks ago and he was prescribed a tapering dose of Prednisone. He developed palpitations on the Prednisone, which abated after he stopped the Prednisone, took it for 7 days. His rectal bleeding did not improve while he took the Prednisone. He denies having any abdominal or rectal pain. He denies having any active reflux symptoms. He is taking Omeprazole 40mg  once daily. No NSAIDs except topical Voltaren gel.  His most recent colonoscopy was 04/23/2017 which showed: The proximal sigmoid colon, descending colon, splenic flexure, transverse colon, hepatic flexure, ascending colon, cecum, appendiceal orifice and ileocecal valve was normal. One 6 mm polyp in the mid sigmoid colon, congested, erythematous, eroded and ulcerated mucosa in the distal sigmoid colon. One 4 mm polyp in the rectum and internal hemorrhoids.  Biopsy results: Colon, polyp(s), proximal sigmoid and rectal - TUBULAR ADENOMA (1 OF 2 FRAGMENTS) - HYPERPLASTIC POLYP (1 OF 2 FRAGMENTS) - NO HIGH GRADE DYSPLASIA OR MALIGNANCY IDENTIFIED 2. Colon, biopsy, sigmoid - COLONIC MUCOSA WITH PATCHY ULCERATION AND CRYPT ABSCESSES   Past Medical History:  Diagnosis Date  . Dysrhythmia   . Essential hypertension, benign 04/01/2017  . GERD (gastroesophageal  reflux disease)   . High cholesterol 04/01/2017  . Rectal bleeding 04/01/2017  . UC (ulcerative colitis confined to rectum) (HCC) 04/01/2017   Past Surgical History:  Procedure Laterality Date  . COLONOSCOPY N/A 04/23/2017   Procedure: COLONOSCOPY;  Surgeon: [name], MD;  Location: [facility];  Service: Endoscopy;  Laterality: N/A;  7:30  . GASTRECTOMY     ulcers  . PROSTATECTOMY      Current Outpatient Medications on File Prior to Visit  Medication Sig Dispense Refill  . APRISO 0.375 g 24 hr capsule TAKE FOUR CAPSULES BY MOUTH DAILY 120 capsule 11  . atorvastatin (LIPITOR) 10 MG tablet Take 10 mg by mouth daily.    Marland Kitchen diltiazem (CARDIZEM CD) 180 MG 24 hr capsule Take 180 mg by mouth daily.    . fexofenadine (ALLEGRA) 180 MG tablet Take 180 mg by mouth daily.    Marland Kitchen lisinopril-hydrochlorothiazide (PRINZIDE,ZESTORETIC) 20-12.5 MG tablet Take 1 tablet by mouth daily.    . mirtazapine (REMERON) 30 MG tablet Take 30 mg by mouth at bedtime.    Marland Kitchen omeprazole (PRILOSEC) 40 MG capsule Take 40 mg by mouth daily.    . rivaroxaban (XARELTO) 20 MG TABS tablet Take 1 tablet (20 mg total) by mouth daily with supper. 30 tablet   . diclofenac sodium (VOLTAREN) 1 % GEL Apply topically 4 (four) times daily.    . mesalamine (CANASA) 1000 MG suppository INSERT ONE SUPPOSITORY RECTALLY AT BEDTIME 30 suppository 1   No current facility-administered medications on file prior to visit.    Review of Systems  See HPI, all other  systems reviewed and are negative      Objective:   Physical Exam BP 138/66   Pulse 79   Temp 98.6 F (37 C) (Oral)   Resp 18   Ht 6\' 2"  (1.88 m)   Wt 252 lb 8 oz (114.5 kg)   BMI 32.42 kg/m  General: 78 y.o. male well developed in NAD, slightly heard of hearing Eyes: sclera nonicteric, conjunctiva pink Heart: RRR, no murmur Lungs: clear throughout Abdomen: soft, nontender, midline abdominal scar, + BS x 4 quads, no HSM Rectal: enlarged prostate, no external  hemorrhoids, internal hemorrhoids, trace amount of blood on the exam glove, no stool or mucous   Assessment & Plan:   1. 78 y.o. male with hx of  afib on Xarelto with ulcerative colitis on Apriso presents with chronic rectal bleeding that did not improve after Canasa and trial of Prednisone -schedule a flexible sigmoidoscopy to rule out ulcerative proctitis vs internal hemorrhoidal bleeding, will need to hold Xarelto for 2 days, our office staff will contact his PCP to verify if ok to hold Xarelto (patient stated his PCP prescribes Xarelto, he no longer sees a cardiologist) -CBC, CMP and CRP -further follow up to be determined after flex sig completed   2. GERD is stable, prior hx of PUD s/p partial gastrectomy ? 10+ years ago -continue Omeprazole 40mg  once daily   3. Hx of afib on Xarelto, heart rhythm regular on exam today  4. Hx of colon polyps, last colonoscopy in 2018 as documented above  -further follow up to be determined after flex sigmoidoscopy completed

## 2019-05-05 NOTE — Patient Instructions (Signed)
1. Schedule a flexible sigmoidoscopy with Dr. Laural Golden  2. Continue Apriso 0.31m two tabs by mouth twice daily  3. Call our office if your rectal bleeding worsens

## 2019-05-10 ENCOUNTER — Encounter (INDEPENDENT_AMBULATORY_CARE_PROVIDER_SITE_OTHER): Payer: Self-pay | Admitting: *Deleted

## 2019-05-10 ENCOUNTER — Encounter: Payer: Self-pay | Admitting: Cardiology

## 2019-05-10 DIAGNOSIS — K51811 Other ulcerative colitis with rectal bleeding: Secondary | ICD-10-CM | POA: Insufficient documentation

## 2019-05-10 DIAGNOSIS — K5190 Ulcerative colitis, unspecified, without complications: Secondary | ICD-10-CM | POA: Insufficient documentation

## 2019-05-11 ENCOUNTER — Telehealth (INDEPENDENT_AMBULATORY_CARE_PROVIDER_SITE_OTHER): Payer: Self-pay | Admitting: *Deleted

## 2019-05-11 NOTE — Telephone Encounter (Signed)
Per Otila Kluver with Dr Manuella Ghazi, patient can stop Xarelto 2 days prior to procedure, patient aware

## 2019-06-01 NOTE — Patient Instructions (Addendum)
   Your procedure is scheduled on: 06/04/2019  Report to [facility] at  10:15   AM.  Call this number if you have problems the morning of surgery: [phone]   Remember:              Follow Directions on the letter you received from Your Physician's office regarding the Bowel Prep  :  Take these medicines the morning of surgery with A SIP OF WATER:Omeprazole, Diltiazem, and allegra    Do not wear jewelry, make-up or nail polish.    Do not bring valuables to the hospital.  Contacts, dentures or bridgework may not be worn into surgery.  .   Patients discharged the day of surgery will not be allowed to drive home.      Flexible Sigmoidoscopy, Care After This sheet gives you information about how to care for yourself after your procedure. Your health care provider may also give you more specific instructions. If you have problems or questions, contact your health care provider. What can I expect after the procedure? After the procedure, it is common to have:  Abdominal cramping or pain.  Bloating.  A small amount of rectal bleeding if you had a biopsy. Follow these instructions at home:  Take over-the-counter and prescription medicines only as told by your health care provider.  Do not drive for 24 hours if you received a medicine to help you relax (sedative).  Keep all follow-up visits as told by your health care provider. This is important. Contact a health care provider if:  You have abdominal pain or cramping that gets worse or is not helped with medicine.  You continue to have small amounts of rectal bleeding after 24 hours.  You have nausea or vomiting.  You feel weak or dizzy.  You have a fever. Get help right away if:  You pass large blood clots or see a large amount of blood in the toilet after having a bowel movement.  You have nausea or vomiting for more than 24 hours after the procedure. This information is not intended to replace advice given to you by  your health care provider. Make sure you discuss any questions you have with your health care provider. Document Released: 09/14/2013 Document Revised: 05/02/2016 Document Reviewed: 12/09/2015 Elsevier Patient Education  2020 [employer].

## 2019-06-02 ENCOUNTER — Other Ambulatory Visit: Payer: Self-pay

## 2019-06-02 ENCOUNTER — Encounter (HOSPITAL_COMMUNITY)
Admission: RE | Admit: 2019-06-02 | Discharge: 2019-06-02 | Disposition: A | Discharge status: Home / Self Care | Payer: Medicare Other | Source: Ambulatory Visit | Attending: Internal Medicine | Admitting: Internal Medicine

## 2019-06-02 ENCOUNTER — Other Ambulatory Visit (HOSPITAL_COMMUNITY)
Admission: RE | Admit: 2019-06-02 | Discharge: 2019-06-02 | Disposition: A | Discharge status: Home / Self Care | Payer: Medicare Other | Source: Ambulatory Visit | Attending: Internal Medicine | Admitting: Internal Medicine

## 2019-06-02 ENCOUNTER — Encounter (HOSPITAL_COMMUNITY): Payer: Self-pay

## 2019-06-02 DIAGNOSIS — Z79899 Other long term (current) drug therapy: Secondary | ICD-10-CM | POA: Diagnosis not present

## 2019-06-02 DIAGNOSIS — I10 Essential (primary) hypertension: Secondary | ICD-10-CM | POA: Diagnosis not present

## 2019-06-02 DIAGNOSIS — Z7901 Long term (current) use of anticoagulants: Secondary | ICD-10-CM | POA: Diagnosis not present

## 2019-06-02 DIAGNOSIS — K921 Melena: Secondary | ICD-10-CM | POA: Diagnosis present

## 2019-06-02 DIAGNOSIS — Z20828 Contact with and (suspected) exposure to other viral communicable diseases: Secondary | ICD-10-CM | POA: Insufficient documentation

## 2019-06-02 DIAGNOSIS — M1990 Unspecified osteoarthritis, unspecified site: Secondary | ICD-10-CM | POA: Diagnosis not present

## 2019-06-02 DIAGNOSIS — Z01812 Encounter for preprocedural laboratory examination: Secondary | ICD-10-CM | POA: Insufficient documentation

## 2019-06-02 DIAGNOSIS — K51511 Left sided colitis with rectal bleeding: Secondary | ICD-10-CM | POA: Diagnosis not present

## 2019-06-02 DIAGNOSIS — E7800 Pure hypercholesterolemia, unspecified: Secondary | ICD-10-CM | POA: Diagnosis not present

## 2019-06-02 DIAGNOSIS — I4891 Unspecified atrial fibrillation: Secondary | ICD-10-CM | POA: Diagnosis not present

## 2019-06-02 DIAGNOSIS — D649 Anemia, unspecified: Secondary | ICD-10-CM | POA: Diagnosis not present

## 2019-06-02 DIAGNOSIS — K219 Gastro-esophageal reflux disease without esophagitis: Secondary | ICD-10-CM | POA: Diagnosis not present

## 2019-06-02 DIAGNOSIS — Z87891 Personal history of nicotine dependence: Secondary | ICD-10-CM | POA: Diagnosis not present

## 2019-06-02 HISTORY — DX: Unspecified osteoarthritis, unspecified site: M19.90

## 2019-06-02 LAB — CBC
HCT: 27 % — ABNORMAL LOW (ref 39.0–52.0)
Hemoglobin: 7.8 g/dL — ABNORMAL LOW (ref 13.0–17.0)
MCH: 24.5 pg — ABNORMAL LOW (ref 26.0–34.0)
MCHC: 28.9 g/dL — ABNORMAL LOW (ref 30.0–36.0)
MCV: 84.9 fL (ref 80.0–100.0)
Platelets: 419 10*3/uL — ABNORMAL HIGH (ref 150–400)
RBC: 3.18 MIL/uL — ABNORMAL LOW (ref 4.22–5.81)
RDW: 14.3 % (ref 11.5–15.5)
WBC: 9.7 10*3/uL (ref 4.0–10.5)
nRBC: 0 % (ref 0.0–0.2)

## 2019-06-02 LAB — SARS CORONAVIRUS 2 (TAT 6-24 HRS): SARS Coronavirus 2: NEGATIVE

## 2019-06-02 LAB — BASIC METABOLIC PANEL
Anion gap: 8 (ref 5–15)
BUN: 22 mg/dL (ref 8–23)
CO2: 21 mmol/L — ABNORMAL LOW (ref 22–32)
Calcium: 8.5 mg/dL — ABNORMAL LOW (ref 8.9–10.3)
Chloride: 106 mmol/L (ref 98–111)
Creatinine, Ser: 1.5 mg/dL — ABNORMAL HIGH (ref 0.61–1.24)
GFR calc Af Amer: 51 mL/min — ABNORMAL LOW (ref >60)
GFR calc non Af Amer: 44 mL/min — ABNORMAL LOW (ref >60)
Glucose, Bld: 141 mg/dL — ABNORMAL HIGH (ref 70–99)
Potassium: 4.4 mmol/L (ref 3.5–5.1)
Sodium: 135 mmol/L (ref 135–145)

## 2019-06-02 NOTE — Progress Notes (Signed)
Dr. Laural Golden notified of hemoglobin 7.8 ng/dl. No new orders received.

## 2019-06-03 ENCOUNTER — Other Ambulatory Visit (HOSPITAL_COMMUNITY): Admission: RE | Admit: 2019-06-03 | Payer: Medicare Other | Source: Ambulatory Visit

## 2019-06-04 ENCOUNTER — Other Ambulatory Visit: Payer: Self-pay

## 2019-06-04 ENCOUNTER — Encounter (HOSPITAL_COMMUNITY): Payer: Self-pay

## 2019-06-04 ENCOUNTER — Ambulatory Visit (HOSPITAL_COMMUNITY): Payer: Medicare Other | Admitting: Anesthesiology

## 2019-06-04 ENCOUNTER — Encounter (HOSPITAL_COMMUNITY)
Admission: RE | Disposition: A | Discharge status: Home / Self Care | Payer: Self-pay | Source: Home / Self Care | Attending: Internal Medicine

## 2019-06-04 ENCOUNTER — Ambulatory Visit (HOSPITAL_COMMUNITY)
Admission: RE | Admit: 2019-06-04 | Discharge: 2019-06-04 | Disposition: A | Discharge status: Home / Self Care | Payer: Medicare Other | Attending: Internal Medicine | Admitting: Internal Medicine

## 2019-06-04 ENCOUNTER — Other Ambulatory Visit (HOSPITAL_COMMUNITY): Payer: Self-pay | Admitting: *Deleted

## 2019-06-04 DIAGNOSIS — D649 Anemia, unspecified: Secondary | ICD-10-CM | POA: Insufficient documentation

## 2019-06-04 DIAGNOSIS — K5150 Left sided colitis without complications: Secondary | ICD-10-CM | POA: Diagnosis not present

## 2019-06-04 DIAGNOSIS — K219 Gastro-esophageal reflux disease without esophagitis: Secondary | ICD-10-CM | POA: Insufficient documentation

## 2019-06-04 DIAGNOSIS — E7800 Pure hypercholesterolemia, unspecified: Secondary | ICD-10-CM | POA: Insufficient documentation

## 2019-06-04 DIAGNOSIS — K921 Melena: Secondary | ICD-10-CM | POA: Diagnosis not present

## 2019-06-04 DIAGNOSIS — M1990 Unspecified osteoarthritis, unspecified site: Secondary | ICD-10-CM | POA: Insufficient documentation

## 2019-06-04 DIAGNOSIS — Z20828 Contact with and (suspected) exposure to other viral communicable diseases: Secondary | ICD-10-CM | POA: Insufficient documentation

## 2019-06-04 DIAGNOSIS — Z79899 Other long term (current) drug therapy: Secondary | ICD-10-CM | POA: Insufficient documentation

## 2019-06-04 DIAGNOSIS — Z7901 Long term (current) use of anticoagulants: Secondary | ICD-10-CM | POA: Insufficient documentation

## 2019-06-04 DIAGNOSIS — I4891 Unspecified atrial fibrillation: Secondary | ICD-10-CM | POA: Insufficient documentation

## 2019-06-04 DIAGNOSIS — K625 Hemorrhage of anus and rectum: Secondary | ICD-10-CM

## 2019-06-04 DIAGNOSIS — K51511 Left sided colitis with rectal bleeding: Secondary | ICD-10-CM | POA: Diagnosis not present

## 2019-06-04 DIAGNOSIS — K51811 Other ulcerative colitis with rectal bleeding: Secondary | ICD-10-CM

## 2019-06-04 DIAGNOSIS — K5190 Ulcerative colitis, unspecified, without complications: Secondary | ICD-10-CM | POA: Insufficient documentation

## 2019-06-04 DIAGNOSIS — I10 Essential (primary) hypertension: Secondary | ICD-10-CM | POA: Diagnosis not present

## 2019-06-04 DIAGNOSIS — Z87891 Personal history of nicotine dependence: Secondary | ICD-10-CM | POA: Insufficient documentation

## 2019-06-04 HISTORY — PX: FLEXIBLE SIGMOIDOSCOPY: SHX5431

## 2019-06-04 HISTORY — PX: BIOPSY: SHX5522

## 2019-06-04 SURGERY — SIGMOIDOSCOPY, FLEXIBLE
Anesthesia: General

## 2019-06-04 MED ORDER — CHLORHEXIDINE GLUCONATE CLOTH 2 % EX PADS: 6.0000 | MEDICATED_PAD | Freq: Once | CUTANEOUS | Status: DC

## 2019-06-04 MED ORDER — HYDROCODONE-ACETAMINOPHEN 7.5-325 MG PO TABS: 1.0000 | ORAL_TABLET | Freq: Once | ORAL | Status: DC | PRN

## 2019-06-04 MED ORDER — HYDROMORPHONE HCL 1 MG/ML IJ SOLN: 0.2500 mg | INTRAMUSCULAR | Status: DC | PRN

## 2019-06-04 MED ORDER — LACTATED RINGERS IV SOLN
INTRAVENOUS | Status: DC
  Administered 2019-06-04: 11:00:00 via INTRAVENOUS

## 2019-06-04 MED ORDER — PROMETHAZINE HCL 25 MG/ML IJ SOLN: 6.2500 mg | INTRAMUSCULAR | Status: DC | PRN

## 2019-06-04 MED ORDER — MIDAZOLAM HCL 2 MG/2ML IJ SOLN: 0.5000 mg | Freq: Once | INTRAMUSCULAR | Status: DC | PRN

## 2019-06-04 MED ORDER — KETAMINE HCL 10 MG/ML IJ SOLN
INTRAMUSCULAR | Status: DC | PRN
  Administered 2019-06-04: 10 mg via INTRAVENOUS

## 2019-06-04 MED ORDER — PROPOFOL 500 MG/50ML IV EMUL
INTRAVENOUS | Status: DC | PRN
  Administered 2019-06-04: 150 ug/kg/min via INTRAVENOUS

## 2019-06-04 NOTE — Op Note (Signed)
[facility] Patient Name: Marvin Lawson Procedure Date: 06/04/2019 10:55 AM MRN: [id number] Date of Birth: 1940-11-28 Attending MD: Lionel December , MD CSN: [id number] Age: 78 Admit Type: Outpatient Procedure:                Flexible Sigmoidoscopy Indications:              Hematochezia, Left-sided chronic ulcerative colitis Providers:                Lionel December, MD, Loma Messing B. Patsy Lager, RN, Pandora Leiter, Technician Referring MD:             Carmelina Peal Sherryll Burger, MD Medicines:                Propofol per Anesthesia Complications:            No immediate complications. Estimated Blood Loss:     Estimated blood loss was minimal. Procedure:                Pre-Anesthesia Assessment:                           - Prior to the procedure, a History and Physical                            was performed, and patient medications and                            allergies were reviewed. The patient's tolerance of                            previous anesthesia was also reviewed. The risks                            and benefits of the procedure and the sedation                            options and risks were discussed with the patient.                            All questions were answered, and informed consent                            was obtained. Prior Anticoagulants: The patient                            last took Xarelto (rivaroxaban) 3 days prior to the                            procedure. ASA Grade Assessment: IV - A patient                            with severe systemic disease that is a constant  threat to life. After reviewing the risks and                            benefits, the patient was deemed in satisfactory                            condition to undergo the procedure.                           After obtaining informed consent, the scope was                            passed under direct vision. The PCF-[id number]                  ([id number]) scope was introduced through the anus and                            advanced to the the left transverse colon. The                            flexible sigmoidoscopy was accomplished without                            difficulty. The patient tolerated the procedure                            well. The quality of the bowel preparation was                            adequate. Scope In: 11:09:46 AM Scope Out: 11:19:20 AM Total Procedure Duration: 0 hours 9 minutes 34 seconds  Findings:      The perianal and digital rectal examinations were normal.      The proximal sigmoid colon, descending colon, splenic flexure and distal       transverse colon appeared normal.      Inflammation was found in a continuous and circumferential pattern in       the sigmoid colon. This was graded as Mayo Score 2 (moderate, with       marked erythema, absent vascular pattern, friability, erosions).       Biopsies were taken with a cold forceps for histology. The pathology       specimen was placed into Bottle Number 1.      The retroflexed view of the distal rectum and anal verge was normal and       showed no anal or rectal abnormalities.      A few diverticula were found in the sigmoid colon. Impression:               - The proximal sigmoid colon, descending colon,                            splenic flexure and distal transverse colon are                            normal.                           -  Moderately active (Mayo Score 2) ulcerative                            colitis. Biopsied.                           Comment: active disease involing mid.distal sigmoid                            colon. Moderate Sedation:      Per Anesthesia Care Recommendation:           - Discharge patient to home (with escort).                           - Continue present medications.                           - Resume Xarelto (rivaroxaban) at prior dose in 2                            days.                            - Await pathology results.                           - HbsAg, [place] TB gold plus.                           - Type and screen.                           - Transfuse one unit PRBCs 0n09/14/2020.                           Marland Kitchen Procedure Code(s):        --- Professional ---                           [phone], Sigmoidoscopy, flexible; with biopsy, single                            or multiple Diagnosis Code(s):        --- Professional ---                           K92.1, Melena (includes Hematochezia)                           K51.50, Left sided colitis without complications CPT copyright 2019 American Medical Association. All rights reserved. The codes documented in this report are preliminary and upon coder review may  be revised to meet current compliance requirements. Lionel December, MD Lionel December, MD 06/04/2019 11:31:53 AM This report has been signed electronically. Number of Addenda: 0

## 2019-06-04 NOTE — Anesthesia Procedure Notes (Deleted)
Procedure Name: General with mask airway Date/Time: 06/04/2019 11:05 AM Performed by: Andree Elk, Renan Danese A, CRNA Pre-anesthesia Checklist: Timeout performed, Patient being monitored, Suction available, Emergency Drugs available and Patient identified Oxygen Delivery Method: Non-rebreather mask

## 2019-06-04 NOTE — Anesthesia Postprocedure Evaluation (Signed)
Anesthesia Post Note  Patient: Marvin Lawson  Procedure(s) Performed: FLEXIBLE SIGMOIDOSCOPY WITH PROPOFOL (N/A ) BIOPSY  Patient location during evaluation: PACU Anesthesia Type: General Level of consciousness: awake and alert and oriented Pain management: pain level controlled Vital Signs Assessment: post-procedure vital signs reviewed and stable Respiratory status: spontaneous breathing Cardiovascular status: stable Postop Assessment: no apparent nausea or vomiting Anesthetic complications: no     Last Vitals:  Vitals:   06/04/19 1023  BP: (!) 145/56  Pulse: 65  Resp: 18  SpO2: 100%    Last Pain:  Vitals:   06/04/19 1105  TempSrc:   PainSc: 0-No pain                 ADAMS, AMY A

## 2019-06-04 NOTE — H&P (Signed)
Marvin Lawson is an 78 y.o. male.   Chief Complaint: Patient is here for flexible sigmoidoscopy HPI: Patient is 78 year old Caucasian male with history of ulcerative colitis who has been on oral mesalamine.  His last colonoscopy was in August 2018 and he still had active disease sigmoid colon and rectum.  He has been treated with suppositories and prednisone.  He has had chronic bleeding which has accelerated lately.  He states he was given blood transfusion couple months ago.  He passes blood every day.  It is fresh blood.  He has 3-4 bowel movements every morning.  Most of his stools are formed.  He denies abdominal pain melena or rectal bleeding.  He is on Xarelto for atrial fibrillation. Last Xarelto dose was 3 days ago. Family history is positive for UC in her daughter and son.  Hemoglobin 2 days ago was 7.8.  Hemoglobin back in February and March was 7.3 and 7.4 g respectively.  Past Medical History:  Diagnosis Date  . Arthritis   . Dysrhythmia    hx of   . Essential hypertension, benign 04/01/2017  . GERD (gastroesophageal reflux disease)   . High cholesterol 04/01/2017  . Rectal bleeding 04/01/2017  . UC (ulcerative colitis confined to rectum) ([place]) 04/01/2017    Past Surgical History:  Procedure Laterality Date  . COLONOSCOPY N/A 04/23/2017   Procedure: COLONOSCOPY;  Surgeon: Rogene Houston, MD;  [facility];  Service: Endoscopy;  Laterality: N/A;  7:30  . GASTRECTOMY     ulcers  . PROSTATECTOMY      History reviewed. No pertinent family history. Social History:  reports that he has quit smoking. He has never used smokeless tobacco. He reports current alcohol use of about 2.0 standard drinks of alcohol per week. He reports that he does not use drugs.  Allergies:  Allergies  Allergen Reactions  . Prednisone Other (See Comments)    Makes real nervous    Medications Prior to Admission  Medication Sig Dispense Refill  . acetaminophen (TYLENOL) 500 MG tablet  Take 1,500 mg by mouth daily as needed for moderate pain or headache.    . APRISO 0.375 g 24 hr capsule TAKE FOUR CAPSULES BY MOUTH DAILY (Patient taking differently: Take 750 mg by mouth 2 (two) times daily. ) 120 capsule 11  . atorvastatin (LIPITOR) 10 MG tablet Take 10 mg by mouth at bedtime.     Marland Kitchen diltiazem (CARDIZEM CD) 180 MG 24 hr capsule Take 180 mg by mouth every evening.     . fexofenadine (ALLEGRA) 180 MG tablet Take 180 mg by mouth at bedtime.     Marland Kitchen lisinopril-hydrochlorothiazide (PRINZIDE,ZESTORETIC) 20-12.5 MG tablet Take 1 tablet by mouth at bedtime.     . mirtazapine (REMERON) 30 MG tablet Take 30 mg by mouth at bedtime.    Marland Kitchen omeprazole (PRILOSEC) 40 MG capsule Take 40 mg by mouth every evening.     . rivaroxaban (XARELTO) 20 MG TABS tablet Take 1 tablet (20 mg total) by mouth daily with supper. 30 tablet   . mesalamine (CANASA) 1000 MG suppository INSERT ONE SUPPOSITORY RECTALLY AT BEDTIME (Patient not taking: Reported on 05/26/2019) 30 suppository 1    Results for orders placed or performed during the hospital encounter of 06/02/19 (from the past 48 hour(s))  CBC     Status: Abnormal   Collection Time: 06/02/19  2:38 PM  Result Value Ref Range   WBC 9.7 4.0 - 10.5 K/uL   RBC 3.18 (L)  4.22 - 5.81 MIL/uL   Hemoglobin 7.8 (L) 13.0 - 17.0 g/dL   HCT 27.0 (L) 39.0 - 52.0 %   MCV 84.9 80.0 - 100.0 fL   MCH 24.5 (L) 26.0 - 34.0 pg   MCHC 28.9 (L) 30.0 - 36.0 g/dL   RDW 14.3 11.5 - 15.5 %   Platelets 419 (H) 150 - 400 K/uL   nRBC 0.0 0.0 - 0.2 %    Comment: Performed at [facility], [address] [place], [place] [address]  Basic metabolic panel     Status: Abnormal   Collection Time: 06/02/19  2:38 PM  Result Value Ref Range   Sodium 135 135 - 145 mmol/L   Potassium 4.4 3.5 - 5.1 mmol/L   Chloride 106 98 - 111 mmol/L   CO2 21 (L) 22 - 32 mmol/L   Glucose, Bld 141 (H) 70 - 99 mg/dL   BUN 22 8 - 23 mg/dL   Creatinine, Ser 1.50 (H) 0.61 - 1.24 mg/dL   Calcium 8.5 (L) 8.9  - 10.3 mg/dL   GFR calc non Af Amer 44 (L) >60 mL/min   GFR calc Af Amer 51 (L) >60 mL/min   Anion gap 8 5 - 15    Comment: Performed at [facility], [address], [place], [place] [address]  SARS CORONAVIRUS 2 (TAT 6-24 HRS) Nasopharyngeal Nasopharyngeal Swab     Status: None   Collection Time: 06/02/19  2:45 PM   Specimen: Nasopharyngeal Swab  Result Value Ref Range   SARS Coronavirus 2 NEGATIVE NEGATIVE    Comment: (NOTE) SARS-CoV-2 target nucleic acids are NOT DETECTED. The SARS-CoV-2 RNA is generally detectable in upper and lower respiratory specimens during the acute phase of infection. Negative results do not preclude SARS-CoV-2 infection, do not rule out co-infections with other pathogens, and should not be used as the sole basis for treatment or other patient management decisions. Negative results must be combined with clinical observations, patient history, and epidemiological information. The expected result is Negative. Fact Sheet for Patients: SugarRoll.be Fact Sheet for Healthcare Providers: https://www.woods-mathews.com/ This test is not yet approved or cleared by the [place] FDA and  has been authorized for detection and/or diagnosis of SARS-CoV-2 by FDA under an Emergency Use Authorization (EUA). This EUA will remain  in effect (meaning this test can be used) for the duration of the COVID-19 declaration under Section 56 4(b)(1) of the Act, 21 U.S.C. section 360bbb-3(b)(1), unless the authorization is terminated or revoked sooner. Performed at [facility], [place] [address], [place], [place] [address]    No results found.  ROS  Blood pressure (!) 145/56, pulse 65, resp. rate 18, height 6' 2"  (1.88 m), weight 117 kg, SpO2 100 %. Physical Exam  Constitutional: He appears well-developed and well-nourished.  HENT:  Mouth/Throat: Oropharynx is clear and moist.  Eyes: Conjunctivae are normal. No scleral  icterus.  Neck: No thyromegaly present.  Cardiovascular: Normal rate, regular rhythm and normal heart sounds.  No murmur heard. Respiratory: Effort normal and breath sounds normal.  GI:  Abdomen is symmetrical.  Has lower midline scar.  Abdomen is soft and nontender with organomegaly or masses.  Musculoskeletal:        General: No edema.  Lymphadenopathy:    He has no cervical adenopathy.  Neurological: He is alert.  Skin: Skin is warm and dry.     Assessment/Plan Rectal bleeding and anemia in a patient with history of ulcerative colitis was also anticoagulated for A.  Fib. Diagnostic flexible sigmoidoscopy.  Hildred Laser, MD 06/04/2019, 11:02 AM

## 2019-06-04 NOTE — Transfer of Care (Signed)
Immediate Anesthesia Transfer of Care Note  Patient: Marvin Lawson  Procedure(s) Performed: FLEXIBLE SIGMOIDOSCOPY WITH PROPOFOL (N/A ) BIOPSY  Patient Location: PACU  Anesthesia Type:General  Level of Consciousness: awake, alert , oriented and patient cooperative  Airway & Oxygen Therapy: Patient Spontanous Breathing  Post-op Assessment: Report given to RN and Post -op Vital signs reviewed and stable  Post vital signs: Reviewed and stable  Last Vitals:  Vitals Value Taken Time  BP    Temp    Pulse 66 06/04/19 1126  Resp 14 06/04/19 1126  SpO2 97 % 06/04/19 1126  Vitals shown include unvalidated device data.  Last Pain:  Vitals:   06/04/19 1105  TempSrc:   PainSc: 0-No pain         Complications: No apparent anesthesia complications

## 2019-06-04 NOTE — Anesthesia Procedure Notes (Signed)
Performed by: Mickel Baas, CRNA

## 2019-06-04 NOTE — Discharge Instructions (Signed)
Resume Xarelto on 06/06/2019. Resume other medications as before. Resume usual diet. No driving for 24 hours. Physician will call with results of biopsy and blood test and further recommendations. Will arrange for blood transfusion with 1 unit of PRBCs on 06/07/2019.

## 2019-06-04 NOTE — Anesthesia Preprocedure Evaluation (Addendum)
Anesthesia Evaluation  Patient identified by MRN, date of birth, ID band Patient awake    Reviewed: Allergy & Precautions, NPO status , Patient's Chart, lab work & pertinent test results  Airway Mallampati: II  TM Distance: >3 FB Neck ROM: Full    Dental no notable dental hx. (+) Teeth Intact   Pulmonary neg pulmonary ROS, former smoker,    Pulmonary exam normal breath sounds clear to auscultation       Cardiovascular Exercise Tolerance: Poor hypertension, Pt. on medications Normal cardiovascular exam+ dysrhythmias Atrial Fibrillation II Rhythm:Regular Rate:Normal  Now Sinus , on Diltiazem  and Xarelto  + DOE, slight exertion      Neuro/Psych negative neurological ROS  negative psych ROS   GI/Hepatic Neg liver ROS, PUD, GERD  Medicated and Controlled,H/o UC -anemia   Endo/Other  negative endocrine ROS  Renal/GU Renal InsufficiencyRenal disease  negative genitourinary   Musculoskeletal  (+) Arthritis ,   Abdominal   Peds negative pediatric ROS (+)  Hematology negative hematology ROS (+) anemia , Severe -received blood recently by history Latest labs 7.8/27   Anesthesia Other Findings   Reproductive/Obstetrics negative OB ROS                            Anesthesia Physical Anesthesia Plan  ASA: IV  Anesthesia Plan: General   Post-op Pain Management:    Induction: Intravenous  PONV Risk Score and Plan: 2 and Propofol infusion, TIVA and Treatment may vary due to age or medical condition  Airway Management Planned: Simple Face Mask and Nasal Cannula  Additional Equipment:   Intra-op Plan:   Post-operative Plan:   Informed Consent: I have reviewed the patients History and Physical, chart, labs and discussed the procedure including the risks, benefits and alternatives for the proposed anesthesia with the patient or authorized representative who has indicated his/her understanding  and acceptance.     Dental advisory given  Plan Discussed with: CRNA  Anesthesia Plan Comments: (Plan Full PPE use  Plan GA with GETA as needed d/w pt -WTP with same after Q&A  Pt ok with receiving blood if needed )        Anesthesia Quick Evaluation

## 2019-06-05 LAB — HEPATITIS B SURFACE ANTIGEN: Hepatitis B Surface Ag: NEGATIVE

## 2019-06-07 ENCOUNTER — Encounter (HOSPITAL_COMMUNITY)
Admission: RE | Admit: 2019-06-07 | Discharge: 2019-06-07 | Disposition: A | Discharge status: Home / Self Care | Payer: Medicare Other | Source: Home / Self Care | Attending: Internal Medicine | Admitting: Internal Medicine

## 2019-06-07 ENCOUNTER — Other Ambulatory Visit: Payer: Self-pay

## 2019-06-07 DIAGNOSIS — K51511 Left sided colitis with rectal bleeding: Secondary | ICD-10-CM | POA: Diagnosis not present

## 2019-06-07 LAB — QUANTIFERON-TB GOLD PLUS

## 2019-06-07 LAB — HEMOGLOBIN AND HEMATOCRIT, BLOOD
HCT: 26.6 % — ABNORMAL LOW (ref 39.0–52.0)
Hemoglobin: 7.6 g/dL — ABNORMAL LOW (ref 13.0–17.0)

## 2019-06-07 LAB — PREPARE RBC (CROSSMATCH)

## 2019-06-08 ENCOUNTER — Encounter (HOSPITAL_COMMUNITY): Admission: RE | Admit: 2019-06-08 | Payer: Medicare Other | Source: Home / Self Care | Admitting: Internal Medicine

## 2019-06-08 LAB — TYPE AND SCREEN
ABO/RH(D): O POS
Antibody Screen: NEGATIVE
Unit division: 0

## 2019-06-08 LAB — BPAM RBC
Blood Product Expiration Date: 202010142359
Unit Type and Rh: 5100

## 2019-06-09 ENCOUNTER — Telehealth (INDEPENDENT_AMBULATORY_CARE_PROVIDER_SITE_OTHER): Payer: Self-pay | Admitting: Nurse Practitioner

## 2019-06-09 ENCOUNTER — Encounter (HOSPITAL_COMMUNITY): Payer: Self-pay | Admitting: Internal Medicine

## 2019-06-09 NOTE — Telephone Encounter (Signed)
Patient called has some questions - ph# [phone]

## 2019-06-09 NOTE — Telephone Encounter (Signed)
I called patient, he wanted to know the results of his flex sig. He continues to have red blood per the rectum. Flex sig bx show active chronic colitis. Dr. Laural Golden has ordered Humira, waiting for auth. Hg 7.8 on 9/14 down from 7.8 on 9/9. Tammy can you provide patient with Humia update? Pls forward message to Dr. Laural Golden so he knows patient is waiting for tx and he continues to have bleeding from the rectum.

## 2019-06-10 ENCOUNTER — Other Ambulatory Visit (INDEPENDENT_AMBULATORY_CARE_PROVIDER_SITE_OTHER): Payer: Self-pay | Admitting: *Deleted

## 2019-06-10 DIAGNOSIS — Z111 Encounter for screening for respiratory tuberculosis: Secondary | ICD-10-CM

## 2019-06-10 NOTE — Telephone Encounter (Signed)
I talked with Dr.Rehman. He too has talked with the patient. Patient will need to have a repeat QuantiFeron- TB Gold Plus. The previous test that was collected , it had expired. Once the test has resulted , will precede with the PA for the Humira.  Patient has been called and made aware and he is planning on coming tomorrow to [facility].

## 2019-06-11 ENCOUNTER — Other Ambulatory Visit (HOSPITAL_COMMUNITY)
Admission: RE | Admit: 2019-06-11 | Discharge: 2019-06-11 | Disposition: A | Discharge status: Home / Self Care | Payer: Medicare Other | Source: Ambulatory Visit | Attending: Internal Medicine | Admitting: Internal Medicine

## 2019-06-11 DIAGNOSIS — R69 Illness, unspecified: Secondary | ICD-10-CM | POA: Diagnosis present

## 2019-06-14 LAB — QUANTIFERON-TB GOLD PLUS: QuantiFERON-TB Gold Plus: POSITIVE — AB

## 2019-06-14 LAB — QUANTIFERON-TB GOLD PLUS (RQFGPL)
QuantiFERON Mitogen Value: 9.48 IU/mL
QuantiFERON Nil Value: 0.06 IU/mL
QuantiFERON TB1 Ag Value: 2.81 IU/mL
QuantiFERON TB2 Ag Value: 2.87 IU/mL

## 2019-06-15 ENCOUNTER — Other Ambulatory Visit (INDEPENDENT_AMBULATORY_CARE_PROVIDER_SITE_OTHER): Payer: Self-pay | Admitting: *Deleted

## 2019-06-15 ENCOUNTER — Other Ambulatory Visit: Payer: Self-pay

## 2019-06-15 ENCOUNTER — Ambulatory Visit (HOSPITAL_COMMUNITY)
Admission: RE | Admit: 2019-06-15 | Discharge: 2019-06-15 | Disposition: A | Discharge status: Home / Self Care | Payer: Medicare Other | Source: Ambulatory Visit | Attending: Internal Medicine | Admitting: Internal Medicine

## 2019-06-15 DIAGNOSIS — R7612 Nonspecific reaction to cell mediated immunity measurement of gamma interferon antigen response without active tuberculosis: Secondary | ICD-10-CM | POA: Insufficient documentation

## 2019-06-25 ENCOUNTER — Other Ambulatory Visit (HOSPITAL_COMMUNITY): Payer: Self-pay | Admitting: Respiratory Therapy

## 2019-06-25 DIAGNOSIS — J441 Chronic obstructive pulmonary disease with (acute) exacerbation: Secondary | ICD-10-CM

## 2019-07-05 ENCOUNTER — Telehealth (INDEPENDENT_AMBULATORY_CARE_PROVIDER_SITE_OTHER): Payer: Self-pay | Admitting: Internal Medicine

## 2019-07-05 NOTE — Telephone Encounter (Signed)
Patient called wanted to let Dr Laural Golden know that his PCP had him do a blood transfusion and they also put him on diltiazem 180 mg  -  Ph# [phone]

## 2019-07-07 ENCOUNTER — Ambulatory Visit (HOSPITAL_COMMUNITY): Admission: RE | Admit: 2019-07-07 | Payer: Medicare Other | Source: Ambulatory Visit

## 2019-07-07 NOTE — Telephone Encounter (Signed)
This has been noted and Dr.Rehman will be made aware.

## 2019-10-06 DIAGNOSIS — I10 Essential (primary) hypertension: Secondary | ICD-10-CM | POA: Diagnosis not present

## 2019-10-28 DIAGNOSIS — M25562 Pain in left knee: Secondary | ICD-10-CM | POA: Diagnosis not present

## 2019-10-28 DIAGNOSIS — F17210 Nicotine dependence, cigarettes, uncomplicated: Secondary | ICD-10-CM | POA: Diagnosis not present

## 2019-10-28 DIAGNOSIS — Z299 Encounter for prophylactic measures, unspecified: Secondary | ICD-10-CM | POA: Diagnosis not present

## 2019-10-28 DIAGNOSIS — I480 Paroxysmal atrial fibrillation: Secondary | ICD-10-CM | POA: Diagnosis not present

## 2019-10-28 DIAGNOSIS — J449 Chronic obstructive pulmonary disease, unspecified: Secondary | ICD-10-CM | POA: Diagnosis not present

## 2019-10-28 DIAGNOSIS — M170 Bilateral primary osteoarthritis of knee: Secondary | ICD-10-CM | POA: Diagnosis not present

## 2019-10-28 DIAGNOSIS — I10 Essential (primary) hypertension: Secondary | ICD-10-CM | POA: Diagnosis not present

## 2019-10-29 DIAGNOSIS — M25562 Pain in left knee: Secondary | ICD-10-CM | POA: Diagnosis not present

## 2019-10-29 DIAGNOSIS — I10 Essential (primary) hypertension: Secondary | ICD-10-CM | POA: Diagnosis not present

## 2019-10-29 DIAGNOSIS — Z299 Encounter for prophylactic measures, unspecified: Secondary | ICD-10-CM | POA: Diagnosis not present

## 2019-11-04 DIAGNOSIS — I10 Essential (primary) hypertension: Secondary | ICD-10-CM | POA: Diagnosis not present

## 2019-11-17 DIAGNOSIS — I10 Essential (primary) hypertension: Secondary | ICD-10-CM | POA: Diagnosis not present

## 2019-11-17 DIAGNOSIS — K5120 Ulcerative (chronic) proctitis without complications: Secondary | ICD-10-CM | POA: Diagnosis not present

## 2019-11-17 DIAGNOSIS — I739 Peripheral vascular disease, unspecified: Secondary | ICD-10-CM | POA: Diagnosis not present

## 2019-11-17 DIAGNOSIS — M25562 Pain in left knee: Secondary | ICD-10-CM | POA: Diagnosis not present

## 2019-11-17 DIAGNOSIS — Z299 Encounter for prophylactic measures, unspecified: Secondary | ICD-10-CM | POA: Diagnosis not present

## 2019-11-25 DIAGNOSIS — J449 Chronic obstructive pulmonary disease, unspecified: Secondary | ICD-10-CM | POA: Diagnosis not present

## 2019-11-25 DIAGNOSIS — I480 Paroxysmal atrial fibrillation: Secondary | ICD-10-CM | POA: Diagnosis not present

## 2019-11-25 DIAGNOSIS — Z299 Encounter for prophylactic measures, unspecified: Secondary | ICD-10-CM | POA: Diagnosis not present

## 2019-11-25 DIAGNOSIS — K5120 Ulcerative (chronic) proctitis without complications: Secondary | ICD-10-CM | POA: Diagnosis not present

## 2019-11-25 DIAGNOSIS — R0602 Shortness of breath: Secondary | ICD-10-CM | POA: Diagnosis not present

## 2019-11-25 DIAGNOSIS — I10 Essential (primary) hypertension: Secondary | ICD-10-CM | POA: Diagnosis not present

## 2019-11-25 DIAGNOSIS — I739 Peripheral vascular disease, unspecified: Secondary | ICD-10-CM | POA: Diagnosis not present

## 2019-12-10 DIAGNOSIS — J449 Chronic obstructive pulmonary disease, unspecified: Secondary | ICD-10-CM | POA: Diagnosis not present

## 2019-12-10 DIAGNOSIS — I10 Essential (primary) hypertension: Secondary | ICD-10-CM | POA: Diagnosis not present

## 2019-12-10 DIAGNOSIS — K5120 Ulcerative (chronic) proctitis without complications: Secondary | ICD-10-CM | POA: Diagnosis not present

## 2019-12-10 DIAGNOSIS — Z299 Encounter for prophylactic measures, unspecified: Secondary | ICD-10-CM | POA: Diagnosis not present

## 2019-12-10 DIAGNOSIS — I739 Peripheral vascular disease, unspecified: Secondary | ICD-10-CM | POA: Diagnosis not present

## 2019-12-10 DIAGNOSIS — I480 Paroxysmal atrial fibrillation: Secondary | ICD-10-CM | POA: Diagnosis not present

## 2020-01-11 DIAGNOSIS — I10 Essential (primary) hypertension: Secondary | ICD-10-CM | POA: Diagnosis not present

## 2020-02-14 DIAGNOSIS — Z299 Encounter for prophylactic measures, unspecified: Secondary | ICD-10-CM | POA: Diagnosis not present

## 2020-02-14 DIAGNOSIS — K5120 Ulcerative (chronic) proctitis without complications: Secondary | ICD-10-CM | POA: Diagnosis not present

## 2020-02-14 DIAGNOSIS — M25561 Pain in right knee: Secondary | ICD-10-CM | POA: Diagnosis not present

## 2020-02-14 DIAGNOSIS — I872 Venous insufficiency (chronic) (peripheral): Secondary | ICD-10-CM | POA: Diagnosis not present

## 2020-02-14 DIAGNOSIS — M25562 Pain in left knee: Secondary | ICD-10-CM | POA: Diagnosis not present

## 2020-02-15 DIAGNOSIS — I10 Essential (primary) hypertension: Secondary | ICD-10-CM | POA: Diagnosis not present

## 2020-02-15 DIAGNOSIS — Z299 Encounter for prophylactic measures, unspecified: Secondary | ICD-10-CM | POA: Diagnosis not present

## 2020-02-15 DIAGNOSIS — M25562 Pain in left knee: Secondary | ICD-10-CM | POA: Diagnosis not present

## 2020-02-15 DIAGNOSIS — J449 Chronic obstructive pulmonary disease, unspecified: Secondary | ICD-10-CM | POA: Diagnosis not present

## 2020-02-15 DIAGNOSIS — Z87891 Personal history of nicotine dependence: Secondary | ICD-10-CM | POA: Diagnosis not present

## 2020-02-15 DIAGNOSIS — I480 Paroxysmal atrial fibrillation: Secondary | ICD-10-CM | POA: Diagnosis not present

## 2020-02-20 DIAGNOSIS — I10 Essential (primary) hypertension: Secondary | ICD-10-CM | POA: Diagnosis not present

## 2020-03-22 DIAGNOSIS — I10 Essential (primary) hypertension: Secondary | ICD-10-CM | POA: Diagnosis not present

## 2020-04-05 DIAGNOSIS — K5120 Ulcerative (chronic) proctitis without complications: Secondary | ICD-10-CM | POA: Diagnosis not present

## 2020-04-05 DIAGNOSIS — Z299 Encounter for prophylactic measures, unspecified: Secondary | ICD-10-CM | POA: Diagnosis not present

## 2020-04-05 DIAGNOSIS — M170 Bilateral primary osteoarthritis of knee: Secondary | ICD-10-CM | POA: Diagnosis not present

## 2020-04-05 DIAGNOSIS — I480 Paroxysmal atrial fibrillation: Secondary | ICD-10-CM | POA: Diagnosis not present

## 2020-04-05 DIAGNOSIS — J449 Chronic obstructive pulmonary disease, unspecified: Secondary | ICD-10-CM | POA: Diagnosis not present

## 2020-04-05 DIAGNOSIS — I10 Essential (primary) hypertension: Secondary | ICD-10-CM | POA: Diagnosis not present

## 2020-04-21 DIAGNOSIS — I10 Essential (primary) hypertension: Secondary | ICD-10-CM | POA: Diagnosis not present

## 2020-05-11 DIAGNOSIS — I10 Essential (primary) hypertension: Secondary | ICD-10-CM | POA: Diagnosis not present

## 2020-06-21 DIAGNOSIS — Z79899 Other long term (current) drug therapy: Secondary | ICD-10-CM | POA: Diagnosis not present

## 2020-06-21 DIAGNOSIS — Z7189 Other specified counseling: Secondary | ICD-10-CM | POA: Diagnosis not present

## 2020-06-21 DIAGNOSIS — E7800 Pure hypercholesterolemia, unspecified: Secondary | ICD-10-CM | POA: Diagnosis not present

## 2020-06-21 DIAGNOSIS — Z299 Encounter for prophylactic measures, unspecified: Secondary | ICD-10-CM | POA: Diagnosis not present

## 2020-06-21 DIAGNOSIS — R5383 Other fatigue: Secondary | ICD-10-CM | POA: Diagnosis not present

## 2020-06-21 DIAGNOSIS — Z0000 Encounter for general adult medical examination without abnormal findings: Secondary | ICD-10-CM | POA: Diagnosis not present

## 2020-06-22 DIAGNOSIS — Z299 Encounter for prophylactic measures, unspecified: Secondary | ICD-10-CM | POA: Diagnosis not present

## 2020-06-22 DIAGNOSIS — I10 Essential (primary) hypertension: Secondary | ICD-10-CM | POA: Diagnosis not present

## 2020-06-22 DIAGNOSIS — M25562 Pain in left knee: Secondary | ICD-10-CM | POA: Diagnosis not present

## 2020-06-23 DIAGNOSIS — M25561 Pain in right knee: Secondary | ICD-10-CM | POA: Diagnosis not present

## 2020-06-23 DIAGNOSIS — I739 Peripheral vascular disease, unspecified: Secondary | ICD-10-CM | POA: Diagnosis not present

## 2020-06-23 DIAGNOSIS — J449 Chronic obstructive pulmonary disease, unspecified: Secondary | ICD-10-CM | POA: Diagnosis not present

## 2020-06-23 DIAGNOSIS — I480 Paroxysmal atrial fibrillation: Secondary | ICD-10-CM | POA: Diagnosis not present

## 2020-06-23 DIAGNOSIS — Z299 Encounter for prophylactic measures, unspecified: Secondary | ICD-10-CM | POA: Diagnosis not present

## 2020-06-23 DIAGNOSIS — I10 Essential (primary) hypertension: Secondary | ICD-10-CM | POA: Diagnosis not present

## 2020-07-11 ENCOUNTER — Encounter: Payer: Self-pay | Admitting: Cardiology

## 2020-07-11 DIAGNOSIS — I10 Essential (primary) hypertension: Secondary | ICD-10-CM | POA: Diagnosis not present

## 2020-07-11 DIAGNOSIS — Z79899 Other long term (current) drug therapy: Secondary | ICD-10-CM | POA: Diagnosis not present

## 2020-07-11 DIAGNOSIS — I700 Atherosclerosis of aorta: Secondary | ICD-10-CM | POA: Diagnosis not present

## 2020-07-11 DIAGNOSIS — E785 Hyperlipidemia, unspecified: Secondary | ICD-10-CM | POA: Diagnosis not present

## 2020-07-11 DIAGNOSIS — Z9119 Patient's noncompliance with other medical treatment and regimen: Secondary | ICD-10-CM | POA: Diagnosis not present

## 2020-07-11 DIAGNOSIS — R072 Precordial pain: Secondary | ICD-10-CM | POA: Diagnosis not present

## 2020-07-11 DIAGNOSIS — R079 Chest pain, unspecified: Secondary | ICD-10-CM | POA: Diagnosis not present

## 2020-07-21 DIAGNOSIS — I10 Essential (primary) hypertension: Secondary | ICD-10-CM | POA: Diagnosis not present

## 2020-08-22 DIAGNOSIS — I10 Essential (primary) hypertension: Secondary | ICD-10-CM | POA: Diagnosis not present

## 2020-09-20 DIAGNOSIS — I480 Paroxysmal atrial fibrillation: Secondary | ICD-10-CM | POA: Diagnosis not present

## 2020-09-20 DIAGNOSIS — M25562 Pain in left knee: Secondary | ICD-10-CM | POA: Diagnosis not present

## 2020-09-20 DIAGNOSIS — I10 Essential (primary) hypertension: Secondary | ICD-10-CM | POA: Diagnosis not present

## 2020-09-20 DIAGNOSIS — I739 Peripheral vascular disease, unspecified: Secondary | ICD-10-CM | POA: Diagnosis not present

## 2020-09-20 DIAGNOSIS — Z299 Encounter for prophylactic measures, unspecified: Secondary | ICD-10-CM | POA: Diagnosis not present

## 2020-09-21 DIAGNOSIS — M25561 Pain in right knee: Secondary | ICD-10-CM | POA: Diagnosis not present

## 2020-09-21 DIAGNOSIS — I10 Essential (primary) hypertension: Secondary | ICD-10-CM | POA: Diagnosis not present

## 2020-09-21 DIAGNOSIS — Z299 Encounter for prophylactic measures, unspecified: Secondary | ICD-10-CM | POA: Diagnosis not present

## 2020-12-20 DIAGNOSIS — Z299 Encounter for prophylactic measures, unspecified: Secondary | ICD-10-CM | POA: Diagnosis not present

## 2020-12-20 DIAGNOSIS — I10 Essential (primary) hypertension: Secondary | ICD-10-CM | POA: Diagnosis not present

## 2020-12-20 DIAGNOSIS — L0293 Carbuncle, unspecified: Secondary | ICD-10-CM | POA: Diagnosis not present

## 2020-12-20 DIAGNOSIS — J449 Chronic obstructive pulmonary disease, unspecified: Secondary | ICD-10-CM | POA: Diagnosis not present

## 2021-01-25 DIAGNOSIS — Z299 Encounter for prophylactic measures, unspecified: Secondary | ICD-10-CM | POA: Diagnosis not present

## 2021-01-25 DIAGNOSIS — M1712 Unilateral primary osteoarthritis, left knee: Secondary | ICD-10-CM | POA: Diagnosis not present

## 2021-01-25 DIAGNOSIS — M25562 Pain in left knee: Secondary | ICD-10-CM | POA: Diagnosis not present

## 2021-01-25 DIAGNOSIS — Z87891 Personal history of nicotine dependence: Secondary | ICD-10-CM | POA: Diagnosis not present

## 2021-01-25 DIAGNOSIS — I10 Essential (primary) hypertension: Secondary | ICD-10-CM | POA: Diagnosis not present

## 2021-02-01 DIAGNOSIS — K625 Hemorrhage of anus and rectum: Secondary | ICD-10-CM | POA: Diagnosis not present

## 2021-02-01 DIAGNOSIS — I480 Paroxysmal atrial fibrillation: Secondary | ICD-10-CM | POA: Diagnosis not present

## 2021-02-01 DIAGNOSIS — M1711 Unilateral primary osteoarthritis, right knee: Secondary | ICD-10-CM | POA: Diagnosis not present

## 2021-02-01 DIAGNOSIS — M170 Bilateral primary osteoarthritis of knee: Secondary | ICD-10-CM | POA: Diagnosis not present

## 2021-02-01 DIAGNOSIS — I10 Essential (primary) hypertension: Secondary | ICD-10-CM | POA: Diagnosis not present

## 2021-02-01 DIAGNOSIS — Z299 Encounter for prophylactic measures, unspecified: Secondary | ICD-10-CM | POA: Diagnosis not present

## 2021-02-06 ENCOUNTER — Ambulatory Visit (INDEPENDENT_AMBULATORY_CARE_PROVIDER_SITE_OTHER): Payer: Medicare Other | Admitting: Internal Medicine

## 2021-02-06 ENCOUNTER — Other Ambulatory Visit (INDEPENDENT_AMBULATORY_CARE_PROVIDER_SITE_OTHER): Payer: Self-pay | Admitting: *Deleted

## 2021-02-06 ENCOUNTER — Encounter (INDEPENDENT_AMBULATORY_CARE_PROVIDER_SITE_OTHER): Payer: Self-pay | Admitting: Internal Medicine

## 2021-02-06 ENCOUNTER — Other Ambulatory Visit: Payer: Self-pay

## 2021-02-06 VITALS — BP 135/74 | HR 67 | Temp 98.2°F | Ht 74.0 in | Wt 249.0 lb

## 2021-02-06 DIAGNOSIS — K5130 Ulcerative (chronic) rectosigmoiditis without complications: Secondary | ICD-10-CM | POA: Diagnosis not present

## 2021-02-06 DIAGNOSIS — K921 Melena: Secondary | ICD-10-CM

## 2021-02-06 DIAGNOSIS — K51811 Other ulcerative colitis with rectal bleeding: Secondary | ICD-10-CM

## 2021-02-06 LAB — CBC
HCT: 38.3 % — ABNORMAL LOW (ref 38.5–50.0)
Hemoglobin: 13 g/dL — ABNORMAL LOW (ref 13.2–17.1)
MCH: 32.5 pg (ref 27.0–33.0)
MCHC: 33.9 g/dL (ref 32.0–36.0)
MCV: 95.8 fL (ref 80.0–100.0)
MPV: 9.8 fL (ref 7.5–12.5)
Platelets: 388 10*3/uL (ref 140–400)
RBC: 4 10*6/uL — ABNORMAL LOW (ref 4.20–5.80)
RDW: 11.8 % (ref 11.0–15.0)
WBC: 12.5 10*3/uL — ABNORMAL HIGH (ref 3.8–10.8)

## 2021-02-06 MED ORDER — HYDROCORTISONE ACETATE 25 MG RE SUPP: 25.0000 mg | Freq: Every day | RECTAL | 0 refills | Status: DC

## 2021-02-06 NOTE — Patient Instructions (Signed)
Physician will call with results of stool test and blood work as soon as it is completed. Anusol HC suppository 1 per rectum daily at bedtime for 2 weeks.

## 2021-02-06 NOTE — Progress Notes (Signed)
Presenting complaint;  Rectal bleeding History of ulcerative colitis.  Database and subjective:  Marvin Lawson is a 80 year old Caucasian male who is here for scheduled visit.  He has 30-year history of ulcerative colitis. His last surveillance colonoscopy was in August 2018 revealing active disease in the sigmoid colon and rectum.  He was treated with mesalamine suppositories and prednisone.  He presented again in August 2020 with rectal bleeding in the setting of chronic anticoagulation.  He underwent flexible sigmoidoscopy.  He had active disease in mid sigmoid colon. His hemoglobin was less than 8 g.  He was given 1 unit of PRBCs.  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  Chest film showed small granuloma in right midlung.  There was no adenopathy or other abnormalities.  Patient was referred to Dr. Luan Pulling of pulmonary service.  He did not follow-up with Dr. Luan Pulling. He also did not return for follow-up visit either.  He states he stopped bleeding and had been doing fine until recently. Patient says he noted fresh blood per rectum about 10 days ago and continued for 3 days.  He was noticed rectal bleeding with his bowel movements.  He is having 2-3 stools per day.  His stools are formed.  He denies abdominal pain urgency or accidents.  He recalls he also had a similar episode last year. His appetite is good and his weight has been stable. He stopped his Xarelto 10 days ago when bleeding started.  He wonders when he can go back on it. He says heartburn is well controlled with pantoprazole.  He denies dysphagia. He says he never misses Apriso dose. He does not take OTC NSAIDs.   Current Medications: Outpatient Encounter Medications as of 02/06/2021  Medication Sig  . acetaminophen (TYLENOL) 500 MG tablet Take 1,500 mg by mouth daily as needed for moderate pain or headache.  . APRISO 0.375 g 24 hr capsule TAKE FOUR CAPSULES BY MOUTH DAILY (Patient taking  differently: Take 750 mg by mouth 2 (two) times daily.)  . atorvastatin (LIPITOR) 10 MG tablet Take 10 mg by mouth at bedtime.   Marland Kitchen diltiazem (CARDIZEM CD) 180 MG 24 hr capsule Take 180 mg by mouth every evening.   . fexofenadine (ALLEGRA) 180 MG tablet Take 180 mg by mouth at bedtime.   Marland Kitchen lisinopril-hydrochlorothiazide (PRINZIDE,ZESTORETIC) 20-12.5 MG tablet Take 1 tablet by mouth at bedtime.   . mirtazapine (REMERON) 30 MG tablet Take 30 mg by mouth at bedtime.  Marland Kitchen omeprazole (PRILOSEC) 40 MG capsule Take 40 mg by mouth every evening.   . rivaroxaban (XARELTO) 20 MG TABS tablet Take 1 tablet (20 mg total) by mouth daily with supper. (Patient not taking: Reported on 02/06/2021)   No facility-administered encounter medications on file as of 02/06/2021.   Past Medical History:  Diagnosis Date  . Arthritis   . Dysrhythmia    hx of   . Essential hypertension, benign 04/01/2017  . GERD (gastroesophageal reflux disease)   . High cholesterol 04/01/2017  .  Distal ulcerative colitis 04/01/2017  .  04/01/2017   Past Surgical History:  Procedure Laterality Date  . BIOPSY  06/04/2019   Procedure: BIOPSY;  Surgeon: Rogene Houston, MD;  [facility];  Service: Endoscopy;;  sigmoid  . COLONOSCOPY N/A 04/23/2017   Procedure: COLONOSCOPY;  Surgeon: Rogene Houston, MD;  [facility];  Service: Endoscopy;  Laterality: N/A;  7:30  . FLEXIBLE SIGMOIDOSCOPY N/A 06/04/2019   Procedure: FLEXIBLE SIGMOIDOSCOPY  WITH PROPOFOL;  Surgeon: Rogene Houston, MD;  [facility];  Service: Endoscopy;  Laterality: N/A;  11:40  . GASTRECTOMY     ulcers  . PROSTATECTOMY     Allergies  Allergen Reactions  . Prednisone Other (See Comments)    Makes real nervous     Objective: Blood pressure 135/74, pulse 67, temperature 98.2 F (36.8 C), temperature source Oral, height 6' 2"  (1.88 m), weight 249 lb (112.9 kg). Patient is alert and in no acute distress. He is wearing a  mask. Conjunctiva is pink. Sclera is nonicteric Oropharyngeal mucosa is normal. No neck masses or thyromegaly noted. Cardiac exam with regular rhythm normal S1 and S2. No murmur or gallop noted. Lungs are clear to auscultation. Abdomen is symmetrical.  He has upper midline and lower midline scars.  On palpation abdomen is soft and nontender with organomegaly or masses. Rectal examination reveals scant amount of stool and the vault and it is guaiac negative. No LE edema or clubbing noted.  Labs/studies Results:  No recent lab data available for review.    Assessment:  #1.  Hematochezia.  Hematochezia occurred about 10 days ago and last for 3 days.  He does have history of internal hemorrhoids documented on colonoscopy of August 2018.  He is chronically anticoagulated because of history of atrial fibrillation.  He does not have symptoms subject active ulcerative colitis.  Therefore it is likely that hematochezia secondary to hemorrhoids.  It is reassuring to know that his stool is guaiac negative.  #2.  Chronic ulcerative colitis.  He has history of distal disease.  He had mild disease activity and sigmoid colon on a flexible sigmoidoscopy of September 2020.  He is having formed stools while on mesalamine.  Based on his symptoms he is in remission.  It would be nice to document that he has normal fecal calprotectin.   Plan:  Patient will go to the lab for CBC. Fecal calprotectin. Further recommendation will be made after blood work and fecal calprotectin results are available for review. Office visit in 1 year.

## 2021-02-07 DIAGNOSIS — K51811 Other ulcerative colitis with rectal bleeding: Secondary | ICD-10-CM | POA: Diagnosis not present

## 2021-02-07 DIAGNOSIS — K921 Melena: Secondary | ICD-10-CM | POA: Diagnosis not present

## 2021-02-07 DIAGNOSIS — K5130 Ulcerative (chronic) rectosigmoiditis without complications: Secondary | ICD-10-CM | POA: Diagnosis not present

## 2021-02-09 DIAGNOSIS — I480 Paroxysmal atrial fibrillation: Secondary | ICD-10-CM | POA: Diagnosis not present

## 2021-02-09 DIAGNOSIS — Z299 Encounter for prophylactic measures, unspecified: Secondary | ICD-10-CM | POA: Diagnosis not present

## 2021-02-09 DIAGNOSIS — I10 Essential (primary) hypertension: Secondary | ICD-10-CM | POA: Diagnosis not present

## 2021-02-09 DIAGNOSIS — H9190 Unspecified hearing loss, unspecified ear: Secondary | ICD-10-CM | POA: Diagnosis not present

## 2021-02-16 LAB — CALPROTECTIN: Calprotectin: 1240 mcg/g — ABNORMAL HIGH

## 2021-02-27 ENCOUNTER — Encounter (INDEPENDENT_AMBULATORY_CARE_PROVIDER_SITE_OTHER): Payer: Self-pay

## 2021-03-22 DIAGNOSIS — Z299 Encounter for prophylactic measures, unspecified: Secondary | ICD-10-CM | POA: Diagnosis not present

## 2021-03-22 DIAGNOSIS — K219 Gastro-esophageal reflux disease without esophagitis: Secondary | ICD-10-CM | POA: Diagnosis not present

## 2021-03-22 DIAGNOSIS — I10 Essential (primary) hypertension: Secondary | ICD-10-CM | POA: Diagnosis not present

## 2021-03-22 DIAGNOSIS — G2581 Restless legs syndrome: Secondary | ICD-10-CM | POA: Diagnosis not present

## 2021-03-22 DIAGNOSIS — K5120 Ulcerative (chronic) proctitis without complications: Secondary | ICD-10-CM | POA: Diagnosis not present

## 2021-03-22 DIAGNOSIS — I872 Venous insufficiency (chronic) (peripheral): Secondary | ICD-10-CM | POA: Diagnosis not present

## 2021-03-23 ENCOUNTER — Encounter (INDEPENDENT_AMBULATORY_CARE_PROVIDER_SITE_OTHER): Payer: Self-pay | Admitting: Internal Medicine

## 2021-03-23 ENCOUNTER — Ambulatory Visit (INDEPENDENT_AMBULATORY_CARE_PROVIDER_SITE_OTHER): Payer: Medicare Other | Admitting: Internal Medicine

## 2021-03-23 ENCOUNTER — Other Ambulatory Visit: Payer: Self-pay

## 2021-03-23 VITALS — BP 147/61 | HR 87 | Temp 98.2°F | Ht 74.0 in | Wt 252.0 lb

## 2021-03-23 DIAGNOSIS — K51311 Ulcerative (chronic) rectosigmoiditis with rectal bleeding: Secondary | ICD-10-CM | POA: Diagnosis not present

## 2021-03-23 NOTE — Patient Instructions (Signed)
Physician will call with results of blood test.

## 2021-03-23 NOTE — Progress Notes (Signed)
error 

## 2021-03-23 NOTE — Progress Notes (Signed)
Presenting complaint;  Follow-up for ulcerative colitis.  Database and subjective:  Patient is 80 year old Caucasian male who was over 30-year history of distal ulcerative colitis who has been maintained on oral mesalamine.  He had active disease on flexible sigmoidoscopy of September 2020.  Disease was limited to the rectum and sigmoid colon.  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  He was evaluated by pulmonologist and treated for 1 year. He was seen on 02/06/2021 for an episode of rectal bleeding.  His hemoglobin was 13 g.  Fecal calprotectin was elevated at 1240.  I did over the results with his daughter Carlyon Shadow but unable to talk with patient.  He feels fine but he notices blood with bowel movements every morning.  He says it is not too cold water red.  He has anywhere from 0-4 stools per day.  Stools are formed.  On some days he has no bowel movements.  He feels his averages 2/day.  He denies urgency accidents or nocturnal bowel movements.  He denies nausea vomiting abdominal pain or anorexia.  His weight has been stable. He recalls that prednisone made him extremely nervous.  He would prefer not to take prednisone anymore. He remains on anticoagulant for history of atrial fibrillation.  Current Medications: Outpatient Encounter Medications as of 03/23/2021  Medication Sig   acetaminophen (TYLENOL) 500 MG tablet Take 1,500 mg by mouth daily as needed for moderate pain or headache.   APRISO 0.375 g 24 hr capsule TAKE FOUR CAPSULES BY MOUTH DAILY (Patient taking differently: Take 750 mg by mouth 2 (two) times daily.)   atorvastatin (LIPITOR) 10 MG tablet Take 10 mg by mouth at bedtime.    diltiazem (CARDIZEM CD) 180 MG 24 hr capsule Take 180 mg by mouth every evening.    fexofenadine (ALLEGRA) 180 MG tablet Take 180 mg by mouth at bedtime.    hydrocortisone (ANUSOL-HC) 25 MG  suppository Place 1 suppository (25 mg total) rectally at bedtime.   lisinopril-hydrochlorothiazide (PRINZIDE,ZESTORETIC) 20-12.5 MG tablet Take 1 tablet by mouth at bedtime.    mirtazapine (REMERON) 30 MG tablet Take 30 mg by mouth at bedtime.   omeprazole (PRILOSEC) 40 MG capsule Take 40 mg by mouth every evening.    rivaroxaban (XARELTO) 20 MG TABS tablet Take 20 mg by mouth daily with supper.   No facility-administered encounter medications on file as of 03/23/2021.     Objective: Blood pressure (!) 147/61, pulse 87, temperature 98.2 F (36.8 C), temperature source Oral, height _0  (1.88 m), weight 252 lb (114.3 kg). Patient is alert and in no acute distress. He has hearing impairment. Is wearing a mask. Conjunctiva is pink. Sclera is nonicteric Oropharyngeal mucosa is normal. He has upper dentures in place. No neck masses or thyromegaly noted. Cardiac exam with regular rhythm normal S1 and S2. No murmur or gallop noted. Lungs are clear to auscultation. Abdomen is full.  On palpation is soft.  He has mild tenderness in LLQ on deep palpation.  No guarding.  No organomegaly or masses. No LE edema or clubbing noted.  Labs/studies Results:   CBC Latest Ref Rng & Units 02/06/2021 06/07/2019 06/02/2019  WBC 3.8 - 10.8 Thousand/uL 12.5(H) - 9.7  Hemoglobin 13.2 - 17.1 g/dL 13.0(L) 7.6(L) 7.8(L)  Hematocrit 38.5 - 50.0 % 38.3(L) 26.6(L) 27.0(L)  Platelets 140 - 400 Thousand/uL 388 - 419(H)    CMP Latest Ref Rng &  Units 06/02/2019 04/02/2017  Glucose 70 - 99 mg/dL 141(H) 117(H)  BUN 8 - 23 mg/dL 22 15  Creatinine 0.61 - 1.24 mg/dL 1.50(H) 1.28(H)  Sodium 135 - 145 mmol/L 135 137  Potassium 3.5 - 5.1 mmol/L 4.4 4.2  Chloride 98 - 111 mmol/L 106 103  CO2 22 - 32 mmol/L 21(L) 25  Calcium 8.9 - 10.3 mg/dL 8.5(L) 9.1  Total Protein 6.5 - 8.1 g/dL - 7.1  Total Bilirubin 0.3 - 1.2 mg/dL - 0.7  Alkaline Phos 38 - 126 U/L - 80  AST 15 - 41 U/L - 24  ALT 17 - 63 U/L - 24    Hepatic  Function Latest Ref Rng & Units 04/02/2017  Total Protein 6.5 - 8.1 g/dL 7.1  Albumin 3.5 - 5.0 g/dL 3.8  AST 15 - 41 U/L 24  ALT 17 - 63 U/L 24  Alk Phosphatase 38 - 126 U/L 80  Total Bilirubin 0.3 - 1.2 mg/dL 0.7   Fecal calprotectin on 02/07/2021  1240.  Assessment  #1.  Distal ulcerative colitis.  He remains with rectal bleeding.  Last endoscopic evaluation was in September 2020 when he had flexible sigmoidoscopy.  His full colonoscopy wasAugust 2028.  Fecal calprotectin is markedly elevated.  His disease is not in remission.  He has taken prednisone in the past but it made him extremely nervous.  This would be short-term treatment anyway and would would not recommend it at this time.  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  He was treated by pulmonologist for 1 year.  Based on his symptoms he has mild disease. Would not recommend biologic at this time.  Low-dose 6-MP would be reasonable.  #2.  Mild anemia.  Anemia secondary to GI bleed.  Hemoglobin 6 weeks ago was 13 g.  He has required blood transfusion in the past but not recently.  Plan  Continue Apriso at present dose of 15 mg/day.Marland Kitchen  He is splitting the dose which is fine. Patient will go to the lab for the following. CBC with differential, LFTs, QuantiFERON-TB gold plus, TPMT assay and HBsAg. I will contact patient and call his daughter Carlyon Shadow with results and we will start him on low-dose 6-MP. Office visit in 3 months.

## 2021-03-31 LAB — HEPATIC FUNCTION PANEL
AG Ratio: 1.5 (calc) (ref 1.0–2.5)
ALT: 18 U/L (ref 9–46)
AST: 16 U/L (ref 10–35)
Albumin: 4 g/dL (ref 3.6–5.1)
Alkaline phosphatase (APISO): 92 U/L (ref 35–144)
Bilirubin, Direct: 0.1 mg/dL (ref 0.0–0.2)
Globulin: 2.6 g/dL (calc) (ref 1.9–3.7)
Indirect Bilirubin: 0.5 mg/dL (calc) (ref 0.2–1.2)
Total Bilirubin: 0.6 mg/dL (ref 0.2–1.2)
Total Protein: 6.6 g/dL (ref 6.1–8.1)

## 2021-03-31 LAB — CBC WITH DIFFERENTIAL/PLATELET
Absolute Monocytes: 845 cells/uL (ref 200–950)
Basophils Absolute: 106 cells/uL (ref 0–200)
Basophils Relative: 1.1 %
Eosinophils Absolute: 278 cells/uL (ref 15–500)
Eosinophils Relative: 2.9 %
HCT: 38.1 % — ABNORMAL LOW (ref 38.5–50.0)
Hemoglobin: 13.3 g/dL (ref 13.2–17.1)
Lymphs Abs: 2218 cells/uL (ref 850–3900)
MCH: 33.8 pg — ABNORMAL HIGH (ref 27.0–33.0)
MCHC: 34.9 g/dL (ref 32.0–36.0)
MCV: 96.7 fL (ref 80.0–100.0)
MPV: 10.7 fL (ref 7.5–12.5)
Monocytes Relative: 8.8 %
Neutro Abs: 6154 cells/uL (ref 1500–7800)
Neutrophils Relative %: 64.1 %
Platelets: 209 10*3/uL (ref 140–400)
RBC: 3.94 10*6/uL — ABNORMAL LOW (ref 4.20–5.80)
RDW: 12.2 % (ref 11.0–15.0)
Total Lymphocyte: 23.1 %
WBC: 9.6 10*3/uL (ref 3.8–10.8)

## 2021-03-31 LAB — QUANTIFERON-TB GOLD PLUS
Mitogen-NIL: >10 IU/mL
NIL: 0.03 IU/mL
QuantiFERON-TB Gold Plus: POSITIVE — AB
TB1-NIL: >10 IU/mL
TB2-NIL: >10 IU/mL

## 2021-03-31 LAB — THIOPURINE METHYLTRANSFERASE (TPMT), RBC: Thiopurine Methyltransferase, RBC: 14 nmol/hr/mL RBC

## 2021-03-31 LAB — HEPATITIS B SURFACE ANTIGEN: Hepatitis B Surface Ag: NONREACTIVE

## 2021-04-12 ENCOUNTER — Other Ambulatory Visit (INDEPENDENT_AMBULATORY_CARE_PROVIDER_SITE_OTHER): Payer: Self-pay | Admitting: Internal Medicine

## 2021-04-12 ENCOUNTER — Telehealth (INDEPENDENT_AMBULATORY_CARE_PROVIDER_SITE_OTHER): Payer: Self-pay | Admitting: Internal Medicine

## 2021-04-12 DIAGNOSIS — K51311 Ulcerative (chronic) rectosigmoiditis with rectal bleeding: Secondary | ICD-10-CM

## 2021-04-12 MED ORDER — MERCAPTOPURINE 50 MG PO TABS: 50.0000 mg | ORAL_TABLET | Freq: Every day | ORAL | 0 refills | Status: DC

## 2021-04-12 NOTE — Telephone Encounter (Signed)
I was finally able to get hold of patient's daughter  Annamary Carolin. All results reviewed with patient. TPMT assay is normal. He does not need any test for positive TB test as he is being treated. Will start patient on 6-MP 50 mg daily. He will have CBC with differential and comprehensive chemistry panel in 1 month. [name] would like to be called for results and appointment etc. in future.

## 2021-04-18 ENCOUNTER — Encounter: Payer: Self-pay | Admitting: *Deleted

## 2021-04-18 ENCOUNTER — Encounter: Payer: Self-pay | Admitting: Cardiology

## 2021-04-18 NOTE — Progress Notes (Signed)
Cardiology Office Note  Date: 04/19/2021   ID: Marvin Lawson, DOB [birth year], MRN [id number]  PCP:  [name], MD  Cardiologist:  Nona Dell, MD Electrophysiologist:  None   Chief Complaint  Patient presents with   Atrial Fibrillation     History of Present Illness: Marvin Lawson is an 80 y.o. male referred for cardiology consultation by Dr. Sherryll Burger for evaluation of atrial fibrillation.  I reviewed the provided records which are limited.  He is here today with his daughter.  He tells me that he was diagnosed with an episode of atrial fibrillation 4 years ago after drinking some strong whiskey and having a coughing spell.  He has been on Xarelto for anticoagulation since then, cannot recall any interval breakthrough episodes of atrial fibrillation.  He does not report any palpitations.  I personally reviewed his ECG today which shows sinus rhythm with PAC.  He has ulcerative colitis followed by Dr. Karilyn Cota and has had trouble with recurrent rectal bleeding requiring PRBC transfusions.  I reviewed the recent notes.  Patient and his daughter indicate that both Dr. Karilyn Cota and Dr. Sherryll Burger have recommended stopping anticoagulation, he is no longer on Xarelto.  I do not see this specifically mentioned in Dr. Patty Sermons notes.  CHA2DS2-VASc score is 3.  Risk of stroke would be reduced by long-term anticoagulation, daily aspirin would not significantly ameliorate this risk.  Having said that, switching to Eliquis from Xarelto would not necessarily be expected to substantially reduce his risk of recurrent GI bleeding in the setting of ulcerative colitis and recurrent episodes of hematochezia.  I mentioned referral for Watchman device as a consideration and summarized the procedure, however he indicates that he is not interested in this at the present time.  For now he voiced most comfort with observation off anticoagulation.  Past Medical History:  Diagnosis Date   Arthritis    COPD  (chronic obstructive pulmonary disease) (HCC)    Depression    Essential hypertension    GERD (gastroesophageal reflux disease)    Hyperlipidemia    PAF (paroxysmal atrial fibrillation) (HCC)    Prostate cancer (HCC)    Rectal bleeding    UC (ulcerative colitis confined to rectum) (HCC)    Varicose veins of both lower extremities     Past Surgical History:  Procedure Laterality Date   BIOPSY  06/04/2019   Procedure: BIOPSY;  Surgeon: [name], MD;  [facility]: [facility];  Service: Endoscopy;;  sigmoid   COLONOSCOPY N/A 04/23/2017   Procedure: COLONOSCOPY;  Surgeon: [name], MD;  Location: [facility];  Service: Endoscopy;  Laterality: N/A;  7:30   FLEXIBLE SIGMOIDOSCOPY N/A 06/04/2019   Procedure: FLEXIBLE SIGMOIDOSCOPY WITH PROPOFOL;  Surgeon: [name], MD;  Location: [facility];  Service: Endoscopy;  Laterality: N/A;  11:40   GASTRECTOMY     ulcers   PROSTATECTOMY      Current Outpatient Medications  Medication Sig Dispense Refill   acetaminophen (TYLENOL) 500 MG tablet Take 1,500 mg by mouth daily as needed for moderate pain or headache.     atorvastatin (LIPITOR) 10 MG tablet Take 10 mg by mouth at bedtime.      diltiazem (CARDIZEM CD) 180 MG 24 hr capsule Take 180 mg by mouth every evening.      ferrous sulfate 325 (65 FE) MG tablet Take 325 mg by mouth daily at 12 noon.     furosemide (LASIX) 20 MG tablet Take 20 mg  by mouth as needed.     lisinopril-hydrochlorothiazide (PRINZIDE,ZESTORETIC) 20-12.5 MG tablet Take 1 tablet by mouth at bedtime.      mercaptopurine (PURINETHOL) 50 MG tablet Take 1 tablet (50 mg total) by mouth daily. Give on an empty stomach 1 hour before or 2 hours after meals. Caution: Chemotherapy. 30 tablet 0   mesalamine (APRISO) 0.375 g 24 hr capsule Take 750 mg by mouth 2 (two) times daily.     mirtazapine (REMERON) 30 MG tablet Take 30 mg by mouth at bedtime.     omeprazole (PRILOSEC) 40 MG capsule Take 40 mg by mouth every  evening.      traMADol (ULTRAM) 50 MG tablet Take 50 mg by mouth as needed.     No current facility-administered medications for this visit.   Allergies:  Prednisone   Social History: The patient  reports that he has quit smoking. He has never used smokeless tobacco. He reports previous alcohol use. He reports that he does not use drugs.   Family History: The patient's family history includes Hypertension in his mother.   ROS: No palpitations or chest pain.  Physical Exam: VS:  BP 118/68   Pulse 70   Ht 6\' 2"  (1.88 m)   Wt 253 lb 12.8 oz (115.1 kg)   SpO2 96%   BMI 32.59 kg/m , BMI Body mass index is 32.59 kg/m.  Wt Readings from Last 3 Encounters:  04/19/21 253 lb 12.8 oz (115.1 kg)  03/23/21 252 lb (114.3 kg)  02/06/21 249 lb (112.9 kg)    General: Elderly male, appears comfortable at rest. HEENT: Conjunctiva and lids normal, wearing a mask. Neck: Supple, no elevated JVP or carotid bruits, no thyromegaly. Lungs: Clear to auscultation, nonlabored breathing at rest. Cardiac: Regular rate and rhythm, no S3, 1/6 systolic murmur, no pericardial rub. Abdomen: Soft, nontender, bowel sounds present. Extremities: Venous stasis, distal pulses 2+. Skin: Warm and dry. Musculoskeletal: No kyphosis. Neuropsychiatric: Alert and oriented x3, affect grossly appropriate.  ECG:  An ECG dated 07/11/2020 was personally reviewed today and demonstrated:  Sinus rhythm.  Recent Labwork: October 2021: PERRL-BNP 212, high-sensitivity troponin I 13, hemoglobin 13.3, platelets 266, potassium 5.0, BUN 34, creatinine 1.46, AST 29, ALT 46 03/23/2021: ALT 18; AST 16; Hemoglobin 13.3; Platelets 209   Other Studies Reviewed Today:  Chest x-ray 07/11/2020: FINDINGS:  The heart size and mediastinal contours are within normal limits.  Aortic atherosclerosis. Both lungs are clear. The visualized  skeletal structures are unremarkable.   IMPRESSION:  No active disease.    Assessment and Plan:  1.   History of atrial fibrillation with episode reportedly 4 years ago as discussed above, no definitive recurrences but certainly at risk for PAF.  CHA2DS2-VASc score is 3.  After thorough discussion detailed above, patient is most comfortable staying off anticoagulation for now in light of his significantly increased risk of recurrent GI bleeding.  Switching to Eliquis from Xarelto would not necessarily be expected to substantially reduce this risk, and he is not interested in watchman device referral.  He does remain at risk for stroke, but hopefully he is not experiencing a significant atrial fibrillation burden in light of his symptoms.  He is in sinus rhythm today.  2.  Mixed hyperlipidemia, on Lipitor.  3.  Essential hypertension, on Prinzide and Cardizem CD, blood pressure is normal today.  Medication Adjustments/Labs and Tests Ordered: Current medicines are reviewed at length with the patient today.  Concerns regarding medicines are outlined above.  Tests Ordered: Orders Placed This Encounter  Procedures   EKG 12-Lead     Medication Changes: No orders of the defined types were placed in this encounter.   Disposition:  Follow up  6 months.  Signed, Jonelle Sidle, MD, [facility] 04/19/2021 1:47 PM    [facility] at [facility] [address] [place], [place], [place] [address] Phone: [phone]; Fax: [phone]

## 2021-04-19 ENCOUNTER — Encounter: Payer: Self-pay | Admitting: Cardiology

## 2021-04-19 ENCOUNTER — Ambulatory Visit (INDEPENDENT_AMBULATORY_CARE_PROVIDER_SITE_OTHER): Payer: Medicare Other | Admitting: Cardiology

## 2021-04-19 ENCOUNTER — Other Ambulatory Visit: Payer: Self-pay

## 2021-04-19 VITALS — BP 118/68 | HR 70 | Ht 74.0 in | Wt 253.8 lb

## 2021-04-19 DIAGNOSIS — I480 Paroxysmal atrial fibrillation: Secondary | ICD-10-CM | POA: Diagnosis not present

## 2021-04-19 DIAGNOSIS — E782 Mixed hyperlipidemia: Secondary | ICD-10-CM | POA: Diagnosis not present

## 2021-04-19 DIAGNOSIS — I10 Essential (primary) hypertension: Secondary | ICD-10-CM | POA: Diagnosis not present

## 2021-04-19 NOTE — Patient Instructions (Signed)

## 2021-04-27 ENCOUNTER — Other Ambulatory Visit (INDEPENDENT_AMBULATORY_CARE_PROVIDER_SITE_OTHER): Payer: Self-pay

## 2021-04-27 ENCOUNTER — Encounter (INDEPENDENT_AMBULATORY_CARE_PROVIDER_SITE_OTHER): Payer: Self-pay

## 2021-04-27 DIAGNOSIS — K51311 Ulcerative (chronic) rectosigmoiditis with rectal bleeding: Secondary | ICD-10-CM

## 2021-05-09 DIAGNOSIS — K51311 Ulcerative (chronic) rectosigmoiditis with rectal bleeding: Secondary | ICD-10-CM | POA: Diagnosis not present

## 2021-05-09 LAB — CBC WITH DIFFERENTIAL/PLATELET
Absolute Monocytes: 743 cells/uL (ref 200–950)
Basophils Absolute: 85 cells/uL (ref 0–200)
Basophils Relative: 0.9 %
Eosinophils Absolute: 216 cells/uL (ref 15–500)
Eosinophils Relative: 2.3 %
HCT: 37.6 % — ABNORMAL LOW (ref 38.5–50.0)
Hemoglobin: 12.9 g/dL — ABNORMAL LOW (ref 13.2–17.1)
Lymphs Abs: 1711 cells/uL (ref 850–3900)
MCH: 33.4 pg — ABNORMAL HIGH (ref 27.0–33.0)
MCHC: 34.3 g/dL (ref 32.0–36.0)
MCV: 97.4 fL (ref 80.0–100.0)
MPV: 9.9 fL (ref 7.5–12.5)
Monocytes Relative: 7.9 %
Neutro Abs: 6646 cells/uL (ref 1500–7800)
Neutrophils Relative %: 70.7 %
Platelets: 380 10*3/uL (ref 140–400)
RBC: 3.86 10*6/uL — ABNORMAL LOW (ref 4.20–5.80)
RDW: 12.2 % (ref 11.0–15.0)
Total Lymphocyte: 18.2 %
WBC: 9.4 10*3/uL (ref 3.8–10.8)

## 2021-05-09 LAB — COMPREHENSIVE METABOLIC PANEL
AG Ratio: 1.6 (calc) (ref 1.0–2.5)
ALT: 20 U/L (ref 9–46)
AST: 13 U/L (ref 10–35)
Albumin: 4.1 g/dL (ref 3.6–5.1)
Alkaline phosphatase (APISO): 86 U/L (ref 35–144)
BUN/Creatinine Ratio: 12 (calc) (ref 6–22)
BUN: 16 mg/dL (ref 7–25)
CO2: 24 mmol/L (ref 20–32)
Calcium: 9.1 mg/dL (ref 8.6–10.3)
Chloride: 104 mmol/L (ref 98–110)
Creat: 1.3 mg/dL — ABNORMAL HIGH (ref 0.70–1.22)
Globulin: 2.5 g/dL (calc) (ref 1.9–3.7)
Glucose, Bld: 132 mg/dL — ABNORMAL HIGH (ref 65–99)
Potassium: 4.7 mmol/L (ref 3.5–5.3)
Sodium: 138 mmol/L (ref 135–146)
Total Bilirubin: 0.6 mg/dL (ref 0.2–1.2)
Total Protein: 6.6 g/dL (ref 6.1–8.1)

## 2021-05-10 ENCOUNTER — Other Ambulatory Visit (INDEPENDENT_AMBULATORY_CARE_PROVIDER_SITE_OTHER): Payer: Self-pay | Admitting: Internal Medicine

## 2021-05-10 MED ORDER — MERCAPTOPURINE 50 MG PO TABS: 50.0000 mg | ORAL_TABLET | Freq: Every day | ORAL | 5 refills | Status: DC

## 2021-06-25 DIAGNOSIS — Z299 Encounter for prophylactic measures, unspecified: Secondary | ICD-10-CM | POA: Diagnosis not present

## 2021-06-25 DIAGNOSIS — Z79899 Other long term (current) drug therapy: Secondary | ICD-10-CM | POA: Diagnosis not present

## 2021-06-25 DIAGNOSIS — I10 Essential (primary) hypertension: Secondary | ICD-10-CM | POA: Diagnosis not present

## 2021-06-25 DIAGNOSIS — Z0000 Encounter for general adult medical examination without abnormal findings: Secondary | ICD-10-CM | POA: Diagnosis not present

## 2021-06-25 DIAGNOSIS — E7800 Pure hypercholesterolemia, unspecified: Secondary | ICD-10-CM | POA: Diagnosis not present

## 2021-06-25 DIAGNOSIS — R5383 Other fatigue: Secondary | ICD-10-CM | POA: Diagnosis not present

## 2021-06-25 DIAGNOSIS — Z23 Encounter for immunization: Secondary | ICD-10-CM | POA: Diagnosis not present

## 2021-06-25 DIAGNOSIS — Z7189 Other specified counseling: Secondary | ICD-10-CM | POA: Diagnosis not present

## 2021-06-26 DIAGNOSIS — Z299 Encounter for prophylactic measures, unspecified: Secondary | ICD-10-CM | POA: Diagnosis not present

## 2021-06-26 DIAGNOSIS — I10 Essential (primary) hypertension: Secondary | ICD-10-CM | POA: Diagnosis not present

## 2021-06-26 DIAGNOSIS — M25562 Pain in left knee: Secondary | ICD-10-CM | POA: Diagnosis not present

## 2021-07-02 ENCOUNTER — Telehealth (INDEPENDENT_AMBULATORY_CARE_PROVIDER_SITE_OTHER): Payer: Self-pay | Admitting: *Deleted

## 2021-07-02 NOTE — Telephone Encounter (Signed)
Pt's daughter called and wanted to know when next bw is due. Last labs 05/09/21. Last ov 03/23/21 and has upcoming appt 07/19/21  Daughter Curtis Sites [phone].

## 2021-07-03 NOTE — Telephone Encounter (Signed)
Per dr Laural Golden no bw due before appt. Will order at visit if any is needed. Left message to return call with pt.

## 2021-07-04 NOTE — Telephone Encounter (Signed)
Pt.notified

## 2021-07-11 DIAGNOSIS — M1711 Unilateral primary osteoarthritis, right knee: Secondary | ICD-10-CM | POA: Diagnosis not present

## 2021-07-11 DIAGNOSIS — I10 Essential (primary) hypertension: Secondary | ICD-10-CM | POA: Diagnosis not present

## 2021-07-11 DIAGNOSIS — Z299 Encounter for prophylactic measures, unspecified: Secondary | ICD-10-CM | POA: Diagnosis not present

## 2021-07-16 ENCOUNTER — Ambulatory Visit (INDEPENDENT_AMBULATORY_CARE_PROVIDER_SITE_OTHER): Payer: Medicare Other | Admitting: Gastroenterology

## 2021-07-18 DIAGNOSIS — H353131 Nonexudative age-related macular degeneration, bilateral, early dry stage: Secondary | ICD-10-CM | POA: Diagnosis not present

## 2021-07-18 DIAGNOSIS — H524 Presbyopia: Secondary | ICD-10-CM | POA: Diagnosis not present

## 2021-07-18 DIAGNOSIS — Z961 Presence of intraocular lens: Secondary | ICD-10-CM | POA: Diagnosis not present

## 2021-07-19 ENCOUNTER — Encounter (INDEPENDENT_AMBULATORY_CARE_PROVIDER_SITE_OTHER): Payer: Self-pay | Admitting: Internal Medicine

## 2021-07-19 ENCOUNTER — Other Ambulatory Visit: Payer: Self-pay

## 2021-07-19 ENCOUNTER — Ambulatory Visit (INDEPENDENT_AMBULATORY_CARE_PROVIDER_SITE_OTHER): Payer: Medicare Other | Admitting: Internal Medicine

## 2021-07-19 VITALS — BP 139/67 | HR 80 | Temp 98.1°F | Ht 74.0 in | Wt 251.0 lb

## 2021-07-19 DIAGNOSIS — K5130 Ulcerative (chronic) rectosigmoiditis without complications: Secondary | ICD-10-CM

## 2021-07-19 NOTE — Patient Instructions (Signed)
Physician will call with results of stool test when completed. Your next blood work will be in 3 months.  Office will review.

## 2021-07-19 NOTE — Progress Notes (Addendum)
Presenting complaint;  Follow-up for ulcerative colitis.  Database and subjective:  Patient is 80 year old Caucasian male was distal ulcerative colitis poorly controlled on oral mesalamine was been experiencing chronic rectal bleeding while on anticoagulation.  His fecal calprotectin in May 2022 was 1240.  TPMT assay was normal and hepatitis B surface antigen was negative.  However his QuantiFERON-TB gold plus was positive.  Patient has previously been treated.  He was not having any respiratory symptoms.  His condition was discussed with specialist at [facility] and no therapy was indicated. He was begun on 6-MP 50 mg daily on 05/10/2021.  Patient states he feels much better.  He saw Dr. Johnny Bridge about 3 months ago and was decided to discontinue his anticoagulant because of recurrent bleeding. Patient states his bleeding stopped about 2 weeks ago.  He is having 1-2 formed stools daily.  He denies abdominal pain.  He has good appetite.  He had blood work by Dr. Manuella Ghazi 3 weeks ago which is reviewed below. He is not having any side effects with 6-MP. He says he takes furosemide no more than couple of times in a month.  Similarly he does not take tramadol often.  He may take as many as 3 doses per month.  He takes this for knee pain.    Current Medications: Outpatient Encounter Medications as of 07/19/2021  Medication Sig   acetaminophen (TYLENOL) 500 MG tablet Take 1,500 mg by mouth daily as needed for moderate pain or headache.   atorvastatin (LIPITOR) 10 MG tablet Take 10 mg by mouth at bedtime.    diltiazem (CARDIZEM CD) 180 MG 24 hr capsule Take 180 mg by mouth every evening.    ferrous sulfate 325 (65 FE) MG tablet Take 325 mg by mouth daily at 12 noon.   furosemide (LASIX) 20 MG tablet Take 20 mg by mouth as needed.   lisinopril-hydrochlorothiazide (PRINZIDE,ZESTORETIC) 20-12.5 MG tablet Take 1 tablet by mouth at bedtime.    mercaptopurine (PURINETHOL) 50 MG tablet Take 1  tablet (50 mg total) by mouth daily. Give on an empty stomach 1 hour before or 2 hours after meals. Caution: Chemotherapy.   mesalamine (APRISO) 0.375 g 24 hr capsule Take 750 mg by mouth 2 (two) times daily.   mirtazapine (REMERON) 30 MG tablet Take 30 mg by mouth at bedtime.   omeprazole (PRILOSEC) 40 MG capsule Take 40 mg by mouth every evening.    traMADol (ULTRAM) 50 MG tablet Take 50 mg by mouth as needed.   No facility-administered encounter medications on file as of 07/19/2021.     Objective: Blood pressure 139/67, pulse 80, temperature 98.1 F (36.7 C), temperature source Oral, height 6' 2"  (1.88 m), weight 251 lb (113.9 kg). Patient is alert and in no acute distress. He has hearing impairment. Conjunctiva is pink. Sclera is nonicteric Oropharyngeal mucosa is normal. No neck masses or thyromegaly noted. Cardiac exam with regular rhythm normal S1 and S2. No murmur or gallop noted. Lungs are clear to auscultation. Abdomen abdomen is full but soft and nontender with organomegaly or masses. No LE edema or clubbing noted.  Labs/studies Results:   CBC Latest Ref Rng & Units 05/09/2021 03/23/2021 02/06/2021  WBC 3.8 - 10.8 Thousand/uL 9.4 9.6 12.5(H)  Hemoglobin 13.2 - 17.1 g/dL 12.9(L) 13.3 13.0(L)  Hematocrit 38.5 - 50.0 % 37.6(L) 38.1(L) 38.3(L)  Platelets 140 - 400 Thousand/uL 380 209 388    CMP Latest Ref Rng & Units 05/09/2021 03/23/2021 06/02/2019  Glucose 65 -  99 mg/dL 132(H) - 141(H)  BUN 7 - 25 mg/dL 16 - 22  Creatinine 0.70 - 1.22 mg/dL 1.30(H) - 1.50(H)  Sodium 135 - 146 mmol/L 138 - 135  Potassium 3.5 - 5.3 mmol/L 4.7 - 4.4  Chloride 98 - 110 mmol/L 104 - 106  CO2 20 - 32 mmol/L 24 - 21(L)  Calcium 8.6 - 10.3 mg/dL 9.1 - 8.5(L)  Total Protein 6.1 - 8.1 g/dL 6.6 6.6 -  Total Bilirubin 0.2 - 1.2 mg/dL 0.6 0.6 -  Alkaline Phos 38 - 126 U/L - - -  AST 10 - 35 U/L 13 16 -  ALT 9 - 46 U/L 20 18 -    Hepatic Function Latest Ref Rng & Units 05/09/2021 03/23/2021 04/02/2017   Total Protein 6.1 - 8.1 g/dL 6.6 6.6 7.1  Albumin 3.5 - 5.0 g/dL - - 3.8  AST 10 - 35 U/L 13 16 24   ALT 9 - 46 U/L 20 18 24   Alk Phosphatase 38 - 126 U/L - - 80  Total Bilirubin 0.2 - 1.2 mg/dL 0.6 0.6 0.7  Bilirubin, Direct 0.0 - 0.2 mg/dL - 0.1 -    Lab data from Dr. Trena Platt office from 06/25/2021 WBC 10.1 ANC 6300 Hemoglobin 14.3  Hematocrit 40.6 Platelet count 368K.  Glucose 115 BUN 28 creatinine 1.31 Serum calcium 9.3/ Bilirubin 0.4, AP 102, AST 15, ALT 18, total protein 6.9 and albumin 4.2  Assessment:  #1.  Distal ulcerative colitis.  He appears to be doing better with combination of oral mesalamine and 6-MP at a low dose.  He has not bled in 2 weeks.  His H&H is coming up which is reassuring.  Would like to see that fecal calprotectin has dropped as well.  #2.  History of anemia secondary to recurrent GI blood loss.  H&H has not normalized since he has stopped bleeding.  It is interesting to note that anticoagulant was stopped 3 months ago but bleeding continued until 2 weeks ago.  Plan:  Continue Apriso at current dose.  He can take all 4 capsules the same time 1 split the dose. Continue 6-MP at 50 mg p.o. daily. Fecal calprotectin. CBC with differential and comprehensive chemistry panel in 3 months. Office visit in 6 months.

## 2021-07-27 IMAGING — DX DG CHEST 2V
2 series · 2 of 2 positions shown · non-contrast
Comparison: June 05, 2015

CLINICAL DATA: Positive QuantiFERON tuberculin test.  Cough.

EXAM:
CHEST - 2 VIEW

[chest pa]
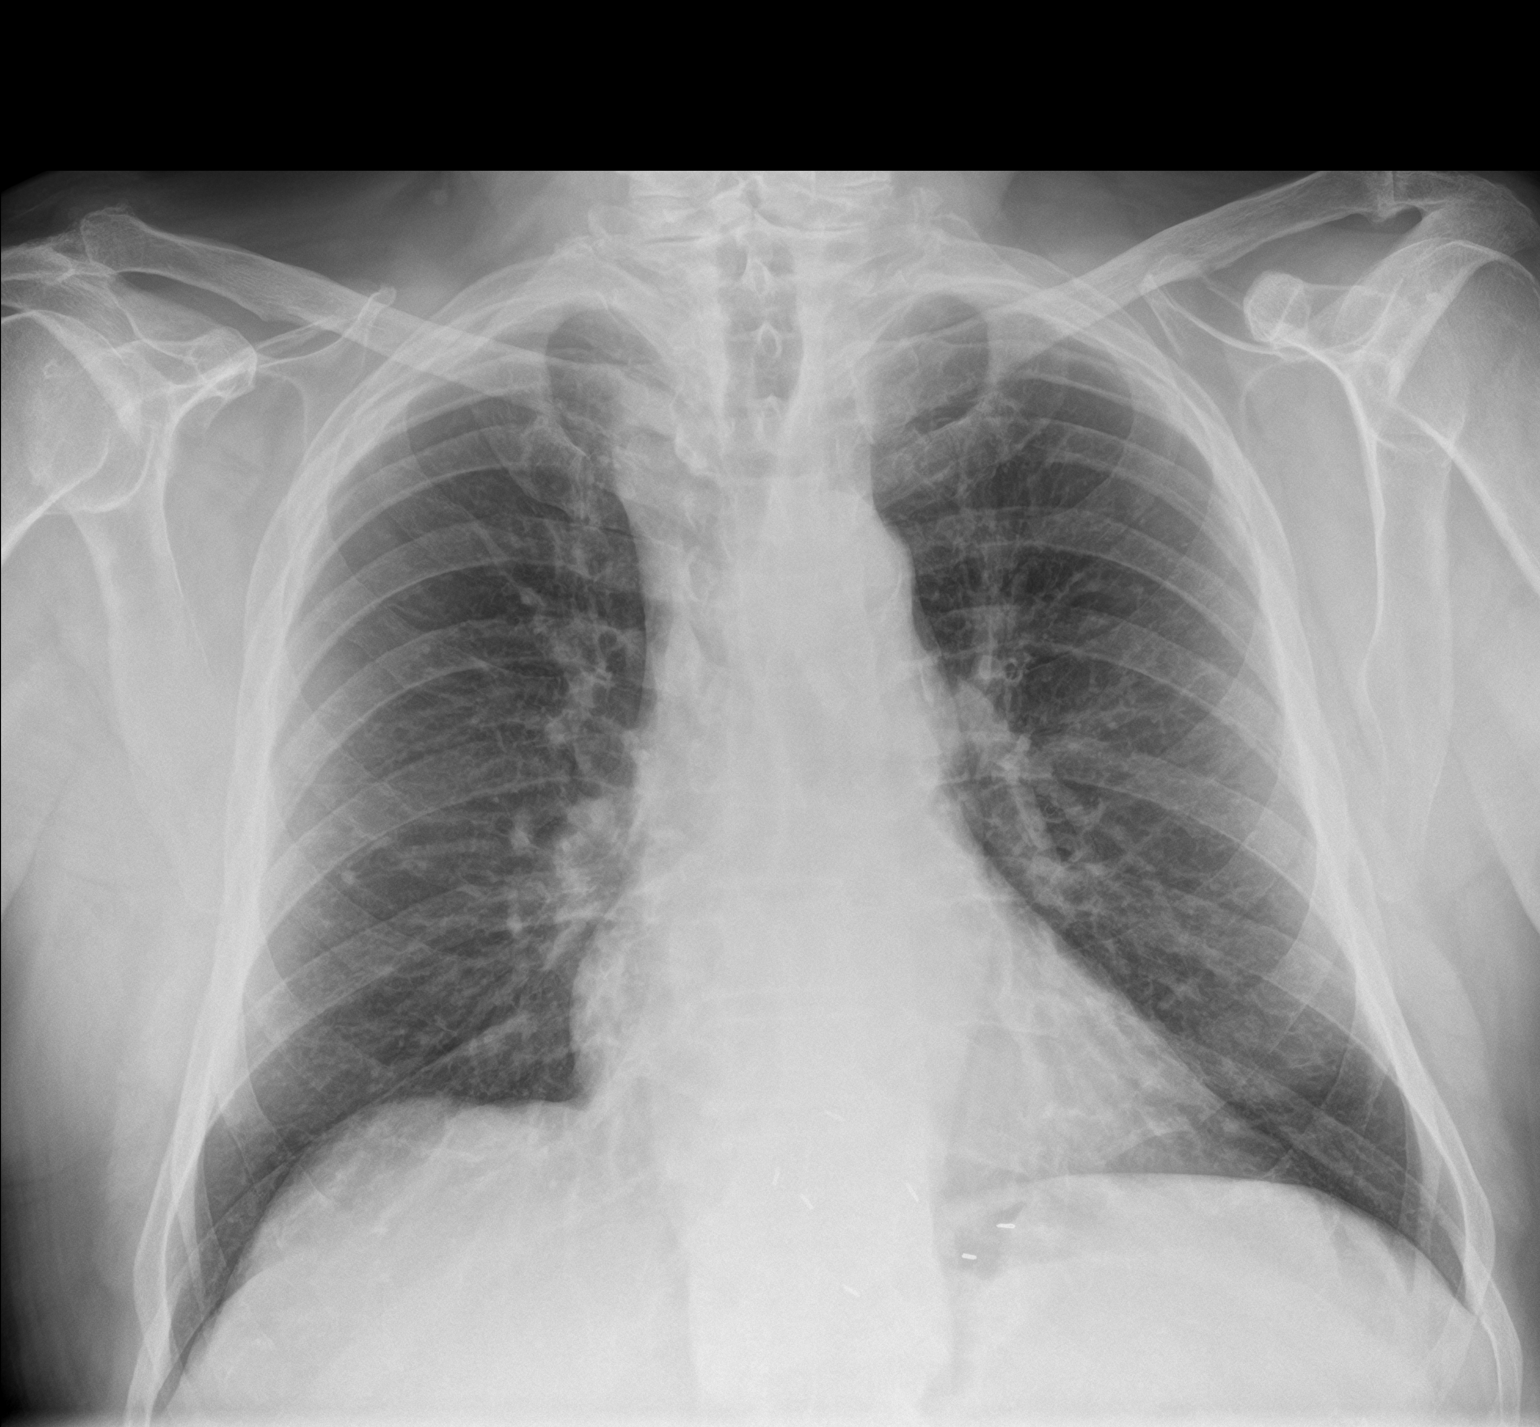

[chest lat]
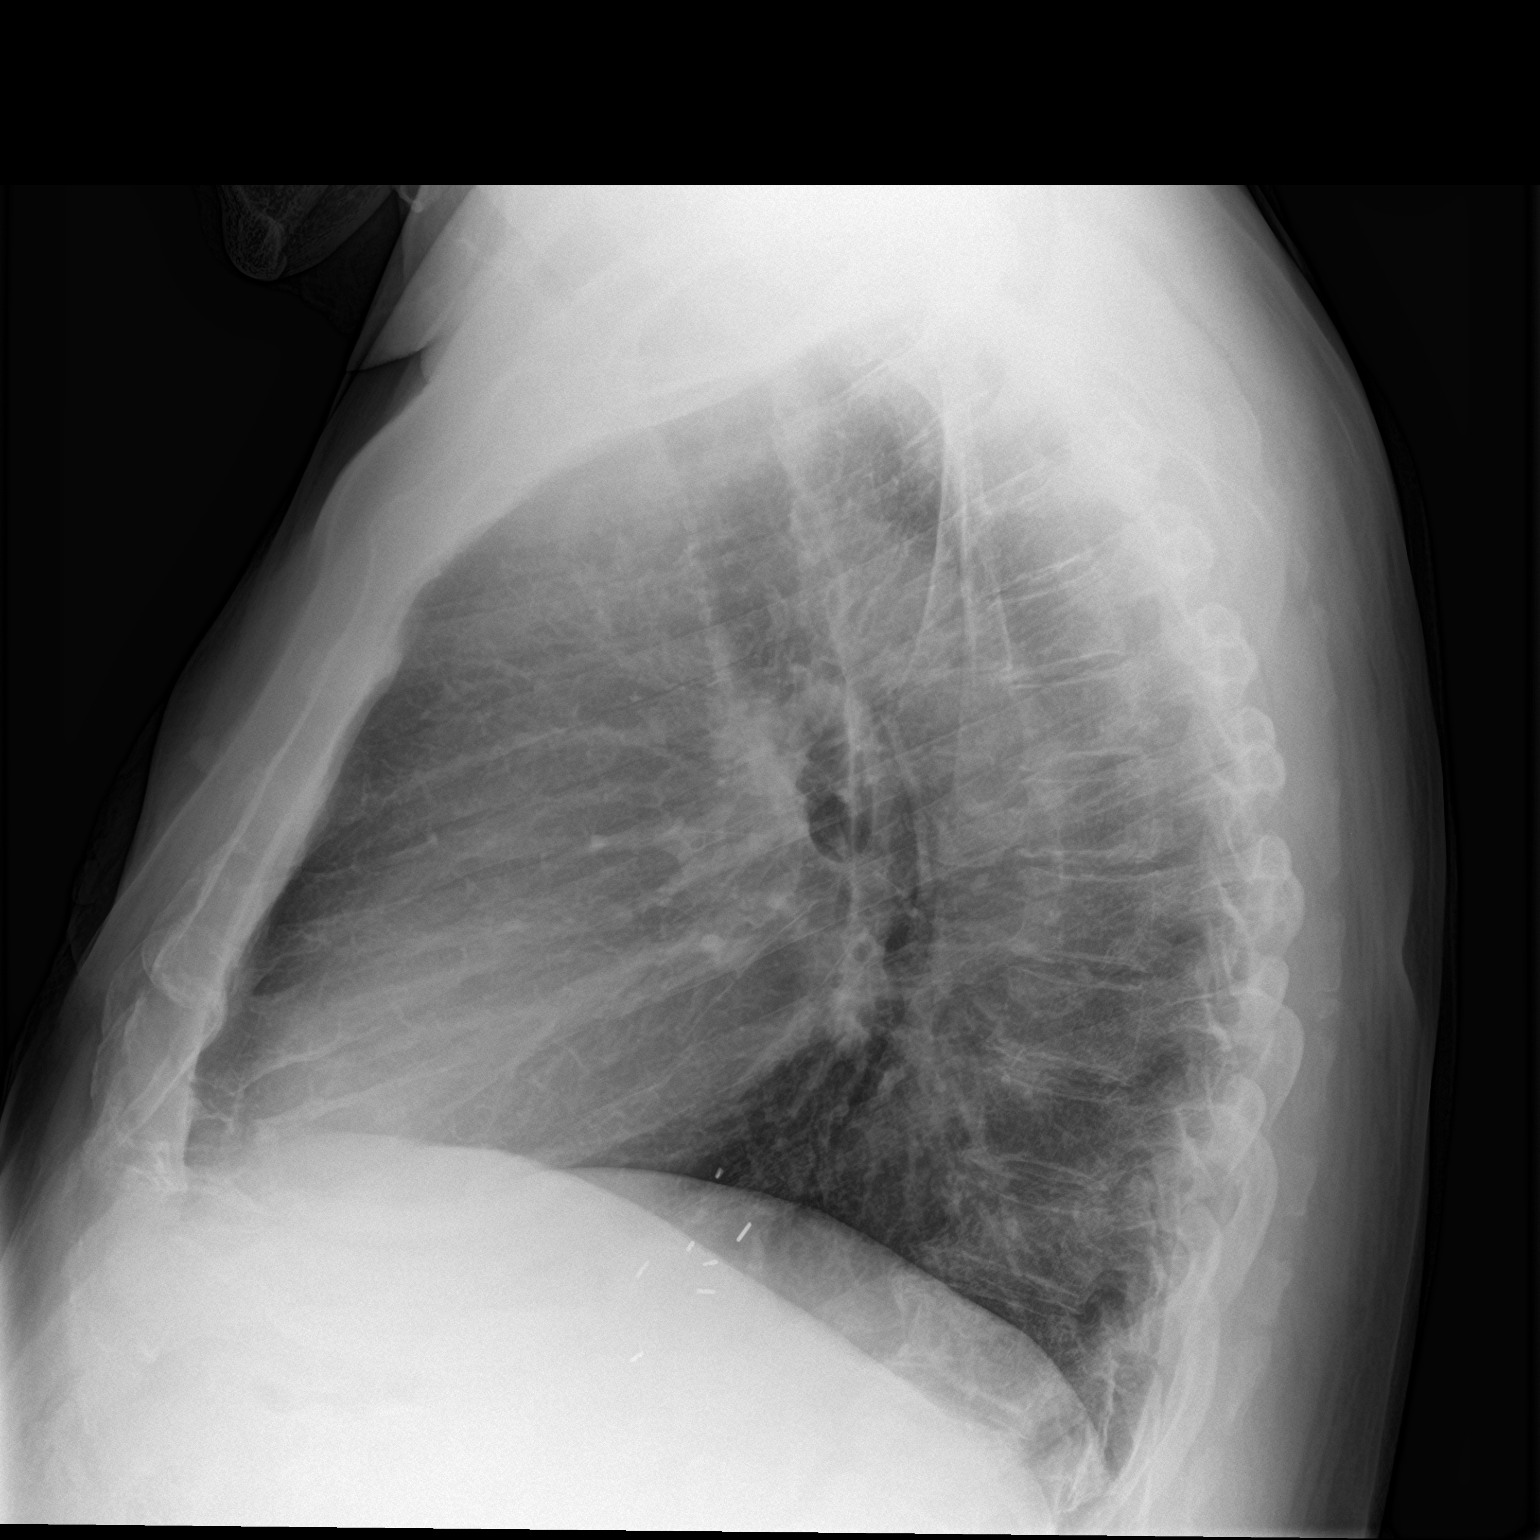

[2 of 2 positions shown; findings below may reference images not displayed]

FINDINGS: There is a small granuloma in the right mid lung. Lungs elsewhere
are clear. Heart size and pulmonary vascularity are normal. No
adenopathy. There is aortic atherosclerosis. There are surgical
clips at the gastroesophageal junction. No bone lesions.
IMPRESSION: Small granuloma right mid lung. Lungs elsewhere clear. Heart size
normal. No adenopathy. Surgical clips at the gastroesophageal
junction. Aortic Atherosclerosis ([id number]-2OL.L).

## 2021-08-03 ENCOUNTER — Encounter (INDEPENDENT_AMBULATORY_CARE_PROVIDER_SITE_OTHER): Payer: Self-pay

## 2021-10-11 DIAGNOSIS — J449 Chronic obstructive pulmonary disease, unspecified: Secondary | ICD-10-CM | POA: Diagnosis not present

## 2021-10-11 DIAGNOSIS — I10 Essential (primary) hypertension: Secondary | ICD-10-CM | POA: Diagnosis not present

## 2021-10-11 DIAGNOSIS — M25562 Pain in left knee: Secondary | ICD-10-CM | POA: Diagnosis not present

## 2021-10-11 DIAGNOSIS — Z299 Encounter for prophylactic measures, unspecified: Secondary | ICD-10-CM | POA: Diagnosis not present

## 2021-10-11 DIAGNOSIS — I739 Peripheral vascular disease, unspecified: Secondary | ICD-10-CM | POA: Diagnosis not present

## 2021-10-11 DIAGNOSIS — Z87891 Personal history of nicotine dependence: Secondary | ICD-10-CM | POA: Diagnosis not present

## 2021-10-12 ENCOUNTER — Other Ambulatory Visit (INDEPENDENT_AMBULATORY_CARE_PROVIDER_SITE_OTHER): Payer: Self-pay | Admitting: *Deleted

## 2021-10-12 DIAGNOSIS — K5130 Ulcerative (chronic) rectosigmoiditis without complications: Secondary | ICD-10-CM

## 2021-11-14 ENCOUNTER — Telehealth (INDEPENDENT_AMBULATORY_CARE_PROVIDER_SITE_OTHER): Payer: Self-pay | Admitting: *Deleted

## 2021-11-14 NOTE — Telephone Encounter (Signed)
1/20 BW orders put in and tried to call patient. No answer. 1/25 [facility] 1/25 left message with son in law to have patient return call 1/26 mailed orders with a note to do bw as soon as possible for dr Laural Golden.

## 2021-12-10 DIAGNOSIS — I10 Essential (primary) hypertension: Secondary | ICD-10-CM | POA: Diagnosis not present

## 2021-12-10 DIAGNOSIS — Z299 Encounter for prophylactic measures, unspecified: Secondary | ICD-10-CM | POA: Diagnosis not present

## 2021-12-10 DIAGNOSIS — Z87891 Personal history of nicotine dependence: Secondary | ICD-10-CM | POA: Diagnosis not present

## 2021-12-10 DIAGNOSIS — M1711 Unilateral primary osteoarthritis, right knee: Secondary | ICD-10-CM | POA: Diagnosis not present

## 2021-12-12 ENCOUNTER — Other Ambulatory Visit (INDEPENDENT_AMBULATORY_CARE_PROVIDER_SITE_OTHER): Payer: Self-pay | Admitting: Internal Medicine

## 2021-12-12 ENCOUNTER — Telehealth (INDEPENDENT_AMBULATORY_CARE_PROVIDER_SITE_OTHER): Payer: Self-pay | Admitting: *Deleted

## 2021-12-12 MED ORDER — MERCAPTOPURINE 50 MG PO TABS: 50.0000 mg | ORAL_TABLET | Freq: Every day | ORAL | 5 refills | Status: DC

## 2021-12-12 NOTE — Telephone Encounter (Signed)
Left message to return call 

## 2021-12-12 NOTE — Telephone Encounter (Signed)
Per dr Laural Golden - take budesonide 40m daily for 2 weeks, then 668mdaily for 2 weeks, then 58m66maily for 2 weeks then go back o n mercaptopurine when finished with budesonide, then do cbc, cmp, fecal calprotein a few days before visit on 01/22/22. Left message to return call to discuss. Dr rehLaural Goldend send in refill of mercaptopurine for pt to start after budesonide.  ?

## 2021-12-12 NOTE — Telephone Encounter (Signed)
Patient Marvin Lawson - want to talk to Dr Laural Golden or nurse ? ?Ph# [phone] ?

## 2021-12-12 NOTE — Telephone Encounter (Signed)
Patient called states he started seeing blood when wiping. States it turns water in toliet red. Started 3 days ago, no sob, no fatigue, no abdominal pain, he is having pain in mid back on right side. Taking mesalamine 4 qam and states he has been out of mercaptopurine for 2 -3 weeks. States refills run out. Has appt 01/22/22. He was in reminder file for bw in [place]. I called multiple times and could not get pt and I mailed orders with a note to get done. Pt states he did not receive messages or orders in mail.  ? ? ?

## 2021-12-13 NOTE — Telephone Encounter (Signed)
Left message to return call 

## 2021-12-14 NOTE — Telephone Encounter (Signed)
Left message to return call 

## 2021-12-17 ENCOUNTER — Other Ambulatory Visit (INDEPENDENT_AMBULATORY_CARE_PROVIDER_SITE_OTHER): Payer: Self-pay | Admitting: *Deleted

## 2021-12-17 MED ORDER — BUDESONIDE 3 MG PO CPEP: ORAL_CAPSULE | ORAL | 0 refills | Status: DC

## 2021-12-17 NOTE — Telephone Encounter (Signed)
And budesonide sent to pharmacy.  ?

## 2021-12-17 NOTE — Telephone Encounter (Signed)
Called and discussed with patient Per dr Laural Golden - take budesonide 87m daily for 2 weeks, then 6110mdaily for 2 weeks, then 12m29maily for 2 weeks then go back o n mercaptopurine when finished with budesonide, then do cbc, cmp, fecal calprotein a few days before visit on 01/22/22. LDr rehman did send in refill of mercaptopurine for pt to start after budesonide.  ?Patient verbalized understanding of all and I added him to reminder to mail out labs to do before appt on 5/2.  ?

## 2021-12-26 NOTE — Telephone Encounter (Signed)
error 

## 2022-01-08 ENCOUNTER — Other Ambulatory Visit (INDEPENDENT_AMBULATORY_CARE_PROVIDER_SITE_OTHER): Payer: Self-pay | Admitting: *Deleted

## 2022-01-08 DIAGNOSIS — K5130 Ulcerative (chronic) rectosigmoiditis without complications: Secondary | ICD-10-CM

## 2022-01-18 DIAGNOSIS — K5130 Ulcerative (chronic) rectosigmoiditis without complications: Secondary | ICD-10-CM | POA: Diagnosis not present

## 2022-01-19 LAB — COMPREHENSIVE METABOLIC PANEL WITH GFR
AG Ratio: 1.5 (calc) (ref 1.0–2.5)
ALT: 24 U/L (ref 9–46)
AST: 15 U/L (ref 10–35)
Albumin: 3.8 g/dL (ref 3.6–5.1)
Alkaline phosphatase (APISO): 73 U/L (ref 35–144)
BUN/Creatinine Ratio: 16 (calc) (ref 6–22)
BUN: 22 mg/dL (ref 7–25)
CO2: 26 mmol/L (ref 20–32)
Calcium: 8.4 mg/dL — ABNORMAL LOW (ref 8.6–10.3)
Chloride: 105 mmol/L (ref 98–110)
Creat: 1.38 mg/dL — ABNORMAL HIGH (ref 0.70–1.22)
Globulin: 2.5 g/dL (ref 1.9–3.7)
Glucose, Bld: 159 mg/dL — ABNORMAL HIGH (ref 65–99)
Potassium: 3.6 mmol/L (ref 3.5–5.3)
Sodium: 141 mmol/L (ref 135–146)
Total Bilirubin: 0.6 mg/dL (ref 0.2–1.2)
Total Protein: 6.3 g/dL (ref 6.1–8.1)

## 2022-01-19 LAB — CBC WITH DIFFERENTIAL/PLATELET
Absolute Monocytes: 948 {cells}/uL (ref 200–950)
Basophils Absolute: 55 {cells}/uL (ref 0–200)
Basophils Relative: 0.5 %
Eosinophils Absolute: 109 {cells}/uL (ref 15–500)
Eosinophils Relative: 1 %
HCT: 40.3 % (ref 38.5–50.0)
Hemoglobin: 13.6 g/dL (ref 13.2–17.1)
Lymphs Abs: 1951 {cells}/uL (ref 850–3900)
MCH: 33.8 pg — ABNORMAL HIGH (ref 27.0–33.0)
MCHC: 33.7 g/dL (ref 32.0–36.0)
MCV: 100.2 fL — ABNORMAL HIGH (ref 80.0–100.0)
MPV: 10.3 fL (ref 7.5–12.5)
Monocytes Relative: 8.7 %
Neutro Abs: 7837 {cells}/uL — ABNORMAL HIGH (ref 1500–7800)
Neutrophils Relative %: 71.9 %
Platelets: 257 10*3/uL (ref 140–400)
RBC: 4.02 Million/uL — ABNORMAL LOW (ref 4.20–5.80)
RDW: 12 % (ref 11.0–15.0)
Total Lymphocyte: 17.9 %
WBC: 10.9 10*3/uL — ABNORMAL HIGH (ref 3.8–10.8)

## 2022-01-21 DIAGNOSIS — K5130 Ulcerative (chronic) rectosigmoiditis without complications: Secondary | ICD-10-CM | POA: Diagnosis not present

## 2022-01-22 ENCOUNTER — Encounter (INDEPENDENT_AMBULATORY_CARE_PROVIDER_SITE_OTHER): Payer: Self-pay | Admitting: Internal Medicine

## 2022-01-22 ENCOUNTER — Ambulatory Visit (INDEPENDENT_AMBULATORY_CARE_PROVIDER_SITE_OTHER): Payer: Medicare Other | Admitting: Internal Medicine

## 2022-01-22 VITALS — BP 146/61 | HR 61 | Temp 97.6°F | Ht 74.0 in | Wt 251.0 lb

## 2022-01-22 DIAGNOSIS — K51311 Ulcerative (chronic) rectosigmoiditis with rectal bleeding: Secondary | ICD-10-CM

## 2022-01-22 MED ORDER — VITAMIN D2 50 MCG (2000 UT) PO TABS: 2000.0000 [IU] | ORAL_TABLET | Freq: Every day | ORAL | Status: AC

## 2022-01-22 NOTE — Patient Instructions (Signed)
Physician will call with result of stool test when completed ?

## 2022-01-22 NOTE — Progress Notes (Addendum)
Presenting complaint; ? ?Follow-up for ulcerative colitis. ? ?Database and subjective: ? ?Patient is is 81 year old Caucasian male who has chronic distal ulcerative colitis of more than 20 years duration who is here for scheduled visit.  He was last seen on 07/19/2021. ?He has surveillance colonoscopy in 2011 when he was noted to be in endoscopic remission.  He had small tubular adenoma. ?He underwent colonoscopy in August 2018 for rectal bleeding in the setting of anticoagulation for atrial fibrillation.  He had active disease in rectum and sigmoid colon.  Small polyp from sigmoid colon was a tubular adenoma and rectal polyp was hyperplastic.  He also had internal hemorrhoids.  He has been maintained on oral mesalamine and treated with Canasa suppositories and hydrocortisone enemas. ?Last year he noted frequent rectal bleeding.  His hemoglobin was low.  States his disease was not well controlled with oral mesalamine he was begun on 6-MP at a dose of 50 mg daily in August 2022. ?At the time of his last visit he noted he was feeling better. ?Patient called office on 12/12/2021 reporting that he was passing blood per rectum again.  He was having formed stool.  He indicated he did not take and 6-MP for over 2 weeks.  He went back on 6-MP and also begun on budesonide. ?Patient is not taking anticoagulant anymore. ? ?He states he is still passing blood per rectum almost daily.  He could not tell any difference since he has been on budesonide.  He is presently on 3 mg daily and he has maybe another week to 10 days left.  He generally has 3 stools daily and they are all formed.  He described bleeding to be small to moderate amount.  He feels his quality of life is 100% in spite of his rectal bleeding.  He denies abdominal pain.  He denies feeling weak or lightheaded.  He is red meat occasionally.  He does not take any NSAIDs. ? ?Current Medications: ?Outpatient Encounter Medications as of 01/22/2022  ?Medication Sig  ?  acetaminophen (TYLENOL) 500 MG tablet Take 1,500 mg by mouth daily as needed for moderate pain or headache.  ? atorvastatin (LIPITOR) 10 MG tablet Take 10 mg by mouth at bedtime.   ? budesonide (ENTOCORT EC) 3 MG 24 hr capsule Take 3 capsules daily for 2 weeks, then 2 capsules daily for 2 weeks then 1 capsule daily for 2 weeks.  ? diltiazem (CARDIZEM CD) 180 MG 24 hr capsule Take 180 mg by mouth every evening.   ? ferrous sulfate 325 (65 FE) MG tablet Take 325 mg by mouth daily at 12 noon.  ? furosemide (LASIX) 20 MG tablet Take 20 mg by mouth as needed.  ? lisinopril-hydrochlorothiazide (PRINZIDE,ZESTORETIC) 20-12.5 MG tablet Take 1 tablet by mouth at bedtime.   ? mercaptopurine (PURINETHOL) 50 MG tablet Take 1 tablet (50 mg total) by mouth daily. Give on an empty stomach 1 hour before or 2 hours after meals. Caution: Chemotherapy.  ? mesalamine (APRISO) 0.375 g 24 hr capsule Take by mouth. Takes 4 capsules in the mornings  ? mirtazapine (REMERON) 30 MG tablet Take 30 mg by mouth at bedtime.  ? omeprazole (PRILOSEC) 40 MG capsule Take 40 mg by mouth every evening.   ? traMADol (ULTRAM) 50 MG tablet Take 50 mg by mouth as needed.  ? ?No facility-administered encounter medications on file as of 01/22/2022.  ? ? ? ?Objective: ?Blood pressure (!) 146/61, pulse 61, temperature 97.6 ?F (36.4 ?C), temperature source Oral,  height 6' 2"  (1.88 m), weight 251 lb (113.9 kg). ?Patient is alert and in no acute distress. ?He has hearing impairment. ?Conjunctiva is pink. Sclera is nonicteric ?Oropharyngeal mucosa is normal. ?No neck masses or thyromegaly noted. ?Cardiac exam with regular rhythm normal S1 and S2. No murmur or gallop noted. ?Lungs are clear to auscultation. ?Abdomen is symmetrical.  He has upper midline scar along with a smaller scar in right paramedian region.  On palpation abdomen is soft nontender with organomegaly or masses. ?He has trace edema around the right ankle. ? ?Labs/studies Results: ? ? ? ?  Latest Ref  Rng & Units 01/18/2022  ? 11:20 AM 05/09/2021  ? 10:11 AM 03/23/2021  ? 10:19 AM  ?CBC  ?WBC 3.8 - 10.8 Thousand/uL 10.9   9.4   9.6    ?Hemoglobin 13.2 - 17.1 g/dL 13.6   12.9   13.3    ?Hematocrit 38.5 - 50.0 % 40.3   37.6   38.1    ?Platelets 140 - 400 Thousand/uL 257   380   209    ?  ? ?  Latest Ref Rng & Units 01/18/2022  ? 11:20 AM 05/09/2021  ? 10:11 AM 03/23/2021  ? 10:19 AM  ?CMP  ?Glucose 65 - 99 mg/dL 159   132     ?BUN 7 - 25 mg/dL 22   16     ?Creatinine 0.70 - 1.22 mg/dL 1.38   1.30     ?Sodium 135 - 146 mmol/L 141   138     ?Potassium 3.5 - 5.3 mmol/L 3.6   4.7     ?Chloride 98 - 110 mmol/L 105   104     ?CO2 20 - 32 mmol/L 26   24     ?Calcium 8.6 - 10.3 mg/dL 8.4   9.1     ?Total Protein 6.1 - 8.1 g/dL 6.3   6.6   6.6    ?Total Bilirubin 0.2 - 1.2 mg/dL 0.6   0.6   0.6    ?AST 10 - 35 U/L 15   13   16     ?ALT 9 - 46 U/L 24   20   18     ?  ? ?  Latest Ref Rng & Units 01/18/2022  ? 11:20 AM 05/09/2021  ? 10:11 AM 03/23/2021  ? 10:19 AM  ?Hepatic Function  ?Total Protein 6.1 - 8.1 g/dL 6.3   6.6   6.6    ?AST 10 - 35 U/L 15   13   16     ?ALT 9 - 46 U/L 24   20   18     ?Total Bilirubin 0.2 - 1.2 mg/dL 0.6   0.6   0.6    ?Bilirubin, Direct 0.0 - 0.2 mg/dL   0.1    ?  ?Fecal calprotectin is pending. ? ? ?Assessment: ? ?#1.  Distal ulcerative colitis.  Disease duration over 20 years.  Sigmoidoscopy in September 2020 revealed active disease in distal sigmoid colon and rectum.  He has not responded to combination of oral mesalamine low-dose 6-MP and budesonide.  Next I would be a biologic therapy however his symptoms are considered to be mild.  There is room to go up on 6-MP.  We will wait for results of fecal calprotectin before further recommendations. ? ?#2.  History of anemia secondary to GI blood loss.  H&H is normal. ? ?Plan: ? ?Wait for results of fecal calprotectin. ?He finished up on desonide.  He  has 7 to 10 days of medication left. ?Continue Apriso at 1500 mg by mouth daily. ?Continue 6-MP at a dose of 50 mg  by mouth daily. ?Office visit in 6 months. ? ? ? ? ? ?

## 2022-01-28 LAB — CALPROTECTIN: Calprotectin: 2990 mcg/g — ABNORMAL HIGH

## 2022-02-04 ENCOUNTER — Other Ambulatory Visit (INDEPENDENT_AMBULATORY_CARE_PROVIDER_SITE_OTHER): Payer: Self-pay | Admitting: *Deleted

## 2022-02-04 DIAGNOSIS — K51311 Ulcerative (chronic) rectosigmoiditis with rectal bleeding: Secondary | ICD-10-CM

## 2022-02-04 MED ORDER — HUMIRA (2 PEN) 40 MG/0.4ML ~~LOC~~ AJKT: AUTO-INJECTOR | SUBCUTANEOUS | 11 refills | Status: DC

## 2022-02-04 MED ORDER — HUMIRA (2 PEN) 80 MG/0.8ML ~~LOC~~ PNKT: PEN_INJECTOR | SUBCUTANEOUS | 0 refills | Status: DC

## 2022-02-12 ENCOUNTER — Ambulatory Visit (INDEPENDENT_AMBULATORY_CARE_PROVIDER_SITE_OTHER): Payer: Medicare Other | Admitting: Internal Medicine

## 2022-02-13 ENCOUNTER — Telehealth (INDEPENDENT_AMBULATORY_CARE_PROVIDER_SITE_OTHER): Payer: Self-pay | Admitting: *Deleted

## 2022-02-13 NOTE — Telephone Encounter (Signed)
error 

## 2022-02-26 ENCOUNTER — Ambulatory Visit (INDEPENDENT_AMBULATORY_CARE_PROVIDER_SITE_OTHER): Payer: Self-pay | Admitting: Internal Medicine

## 2022-02-26 ENCOUNTER — Encounter (INDEPENDENT_AMBULATORY_CARE_PROVIDER_SITE_OTHER): Payer: Self-pay

## 2022-02-26 VITALS — BP 133/70 | HR 57 | Temp 98.1°F | Ht 73.0 in | Wt 250.6 lb

## 2022-02-26 DIAGNOSIS — K51311 Ulcerative (chronic) rectosigmoiditis with rectal bleeding: Secondary | ICD-10-CM

## 2022-03-04 NOTE — Progress Notes (Signed)
Patient has refractory distal ulcerative colitis with daily rectal bleeding. He has not responded to oral mesalamine as well as prednisone. He is therefore getting started on Humira/adalimumab. Patient received 80 mg of subcu today. He was observed in the office for 30 minutes and did not experience any side effects. His daughter Annamary Carolin will administer 80 mg tomorrow and on day 15 and thereafter 40 mg every 14 days. Patient will call office if he has any side effects. Office visit with Dr. Jenetta Downer on 04/25/2022.

## 2022-03-12 DIAGNOSIS — M25462 Effusion, left knee: Secondary | ICD-10-CM | POA: Diagnosis not present

## 2022-03-12 DIAGNOSIS — I70212 Atherosclerosis of native arteries of extremities with intermittent claudication, left leg: Secondary | ICD-10-CM | POA: Diagnosis not present

## 2022-03-12 DIAGNOSIS — M25761 Osteophyte, right knee: Secondary | ICD-10-CM | POA: Diagnosis not present

## 2022-03-12 DIAGNOSIS — M898X6 Other specified disorders of bone, lower leg: Secondary | ICD-10-CM | POA: Diagnosis not present

## 2022-03-12 DIAGNOSIS — M898X5 Other specified disorders of bone, thigh: Secondary | ICD-10-CM | POA: Diagnosis not present

## 2022-03-12 DIAGNOSIS — J449 Chronic obstructive pulmonary disease, unspecified: Secondary | ICD-10-CM | POA: Diagnosis not present

## 2022-03-12 DIAGNOSIS — Z0000 Encounter for general adult medical examination without abnormal findings: Secondary | ICD-10-CM | POA: Diagnosis not present

## 2022-03-12 DIAGNOSIS — M170 Bilateral primary osteoarthritis of knee: Secondary | ICD-10-CM | POA: Diagnosis not present

## 2022-03-12 DIAGNOSIS — Z299 Encounter for prophylactic measures, unspecified: Secondary | ICD-10-CM | POA: Diagnosis not present

## 2022-03-12 DIAGNOSIS — Z87891 Personal history of nicotine dependence: Secondary | ICD-10-CM | POA: Diagnosis not present

## 2022-03-12 DIAGNOSIS — M1711 Unilateral primary osteoarthritis, right knee: Secondary | ICD-10-CM | POA: Diagnosis not present

## 2022-03-12 DIAGNOSIS — M959 Acquired deformity of musculoskeletal system, unspecified: Secondary | ICD-10-CM | POA: Diagnosis not present

## 2022-03-12 DIAGNOSIS — M1712 Unilateral primary osteoarthritis, left knee: Secondary | ICD-10-CM | POA: Diagnosis not present

## 2022-03-12 DIAGNOSIS — M1710 Unilateral primary osteoarthritis, unspecified knee: Secondary | ICD-10-CM | POA: Diagnosis not present

## 2022-03-12 DIAGNOSIS — M85861 Other specified disorders of bone density and structure, right lower leg: Secondary | ICD-10-CM | POA: Diagnosis not present

## 2022-03-12 DIAGNOSIS — I10 Essential (primary) hypertension: Secondary | ICD-10-CM | POA: Diagnosis not present

## 2022-03-12 DIAGNOSIS — M21961 Unspecified acquired deformity of right lower leg: Secondary | ICD-10-CM | POA: Diagnosis not present

## 2022-03-12 DIAGNOSIS — I739 Peripheral vascular disease, unspecified: Secondary | ICD-10-CM | POA: Diagnosis not present

## 2022-03-27 DIAGNOSIS — M1712 Unilateral primary osteoarthritis, left knee: Secondary | ICD-10-CM | POA: Diagnosis not present

## 2022-03-27 DIAGNOSIS — I10 Essential (primary) hypertension: Secondary | ICD-10-CM | POA: Diagnosis not present

## 2022-03-27 DIAGNOSIS — M1711 Unilateral primary osteoarthritis, right knee: Secondary | ICD-10-CM | POA: Diagnosis not present

## 2022-03-27 DIAGNOSIS — Z299 Encounter for prophylactic measures, unspecified: Secondary | ICD-10-CM | POA: Diagnosis not present

## 2022-03-28 DIAGNOSIS — M1711 Unilateral primary osteoarthritis, right knee: Secondary | ICD-10-CM | POA: Diagnosis not present

## 2022-03-28 DIAGNOSIS — Z299 Encounter for prophylactic measures, unspecified: Secondary | ICD-10-CM | POA: Diagnosis not present

## 2022-03-28 DIAGNOSIS — Z87891 Personal history of nicotine dependence: Secondary | ICD-10-CM | POA: Diagnosis not present

## 2022-03-28 DIAGNOSIS — I10 Essential (primary) hypertension: Secondary | ICD-10-CM | POA: Diagnosis not present

## 2022-04-03 ENCOUNTER — Ambulatory Visit (INDEPENDENT_AMBULATORY_CARE_PROVIDER_SITE_OTHER): Payer: Medicare Other | Admitting: Orthopedic Surgery

## 2022-04-03 ENCOUNTER — Encounter: Payer: Self-pay | Admitting: Orthopedic Surgery

## 2022-04-03 VITALS — Ht 72.0 in | Wt 252.0 lb

## 2022-04-03 DIAGNOSIS — M170 Bilateral primary osteoarthritis of knee: Secondary | ICD-10-CM | POA: Diagnosis not present

## 2022-04-03 NOTE — Patient Instructions (Signed)
We will work to get authorization for HA injections.  We can follow up in about 3 months

## 2022-04-03 NOTE — Progress Notes (Signed)
New Patient Visit  Assessment: Marvin Lawson is a 81 y.o. male with the following: Bilateral knee arthritis  Plan: Marvin Lawson has advanced arthritis in bilateral knees.  This has been progressively worsening.  He is not a good candidate for surgery.  He is not interested in surgery.  He had injections in both knees about a week ago.  This has improved his symptoms.  He is interested in HA injections in the future.  He is aware that we cannot repeat injections for 3 months following his most recent knee injections.  We will make plans to obtain authorization for HA injections, and we can make plans to schedule a follow-up appointment to proceed with HA injections at that time.  Follow-up: Return if symptoms worsen or fail to improve.  Subjective:  Chief Complaint  Patient presents with   Knee Pain    Bilat for years getting worse.     History of Present Illness: Marvin Lawson is a 81 y.o. male who presents for evaluation of bilateral knee pain.  He has had pain in both knees for several years.  It is progressively worsening.  He has had a number of injections in both knees, and these tend to provide relief of his symptoms for up to 2 years.  He is on blood thinners, cannot take NSAIDs.  He has had issues using Voltaren gel in the past.  He has also had some adverse effects while taking tramadol, and has been advised not to take this medication as well.  He notes swelling in the knees on occasion.  He did have steroid injections in both knees last week.  These injections have been helpful and his pain is currently better than it was a week ago.   Review of Systems: No fevers or chills No numbness or tingling No chest pain No shortness of breath No bowel or bladder dysfunction No GI distress No headaches   Medical History:  Past Medical History:  Diagnosis Date   Arthritis    COPD (chronic obstructive pulmonary disease) (HCC)    Depression    Essential  hypertension    GERD (gastroesophageal reflux disease)    Hyperlipidemia    PAF (paroxysmal atrial fibrillation) (HCC)    Prostate cancer (HCC)    Rectal bleeding    UC (ulcerative colitis confined to rectum) (HCC)    Varicose veins of both lower extremities     Past Surgical History:  Procedure Laterality Date   BIOPSY  06/04/2019   Procedure: BIOPSY;  Surgeon: Rogene Houston, MD;  Location: [facility];  Service: Endoscopy;;  sigmoid   COLONOSCOPY N/A 04/23/2017   Procedure: COLONOSCOPY;  Surgeon: Rogene Houston, MD;  Location: [facility];  Service: Endoscopy;  Laterality: N/A;  7:30   FLEXIBLE SIGMOIDOSCOPY N/A 06/04/2019   Procedure: FLEXIBLE SIGMOIDOSCOPY WITH PROPOFOL;  Surgeon: Rogene Houston, MD;  Location: [facility];  Service: Endoscopy;  Laterality: N/A;  11:40   GASTRECTOMY     ulcers   PROSTATECTOMY      Family History  Problem Relation Age of Onset   Hypertension Mother    Social History   Tobacco Use   Smoking status: Former    Passive exposure: Past   Smokeless tobacco: Never  Vaping Use   Vaping Use: Never used  Substance Use Topics   Alcohol use: Not Currently   Drug use: No    Allergies  Allergen Reactions   Prednisone Other (See Comments)  Makes real nervous   Tramadol Palpitations    Current Meds  Medication Sig   acetaminophen (TYLENOL) 500 MG tablet Take 1,500 mg by mouth daily as needed for moderate pain or headache.   Adalimumab (HUMIRA PEN) 40 MG/0.4ML PNKT Inject 25m every 2 weeks.   Adalimumab (HUMIRA PEN) 80 MG/0.8ML PNKT Inject 80 mg day 1, day 2, and day 15   atorvastatin (LIPITOR) 10 MG tablet Take 10 mg by mouth at bedtime.    budesonide (ENTOCORT EC) 3 MG 24 hr capsule Take 3 capsules daily for 2 weeks, then 2 capsules daily for 2 weeks then 1 capsule daily for 2 weeks.   diltiazem (CARDIZEM CD) 180 MG 24 hr capsule Take 180 mg by mouth every evening.    Ergocalciferol (VITAMIN D2) 50 MCG (2000 UT) TABS Take 2,000  Units by mouth daily.   ferrous sulfate 325 (65 FE) MG tablet Take 325 mg by mouth daily at 12 noon.   furosemide (LASIX) 20 MG tablet Take 20 mg by mouth as needed.   lisinopril-hydrochlorothiazide (PRINZIDE,ZESTORETIC) 20-12.5 MG tablet Take 1 tablet by mouth at bedtime.    mercaptopurine (PURINETHOL) 50 MG tablet Take 1 tablet (50 mg total) by mouth daily. Give on an empty stomach 1 hour before or 2 hours after meals. Caution: Chemotherapy.   mesalamine (APRISO) 0.375 g 24 hr capsule Take by mouth. Takes 4 capsules in the mornings   mirtazapine (REMERON) 30 MG tablet Take 30 mg by mouth at bedtime.   omeprazole (PRILOSEC) 40 MG capsule Take 40 mg by mouth every evening.     Objective: Ht 6' (1.829 m)   Wt 252 lb (114.3 kg)   BMI 34.18 kg/m   Physical Exam:  General: Alert and oriented. and No acute distress. Gait: Slow, steady gait.  Bilateral knees with neutral alignment overall.  Mild effusion.  10-15 degree flexion contracture of the right knee.  Just short of full extension of the left knee.  Flexion beyond 100 degrees.  Knees are stable to varus and valgus stress.  Tenderness to palpation along the medial joint line bilaterally.  IMAGING: I personally reviewed images previously obtained in clinic  Recent x-rays were evaluated in clinic.  He has advanced degenerative changes, with loss of joint space and osteophytes primarily within the medial compartments.   New Medications:  No orders of the defined types were placed in this encounter.     MMordecai Rasmussen MD  04/03/2022 10:41 PM

## 2022-04-05 ENCOUNTER — Other Ambulatory Visit: Payer: Self-pay | Admitting: Orthopedic Surgery

## 2022-04-05 DIAGNOSIS — M170 Bilateral primary osteoarthritis of knee: Secondary | ICD-10-CM

## 2022-04-25 ENCOUNTER — Ambulatory Visit (INDEPENDENT_AMBULATORY_CARE_PROVIDER_SITE_OTHER): Payer: Medicare Other | Admitting: Gastroenterology

## 2022-04-25 ENCOUNTER — Encounter (INDEPENDENT_AMBULATORY_CARE_PROVIDER_SITE_OTHER): Payer: Self-pay | Admitting: Gastroenterology

## 2022-05-23 DIAGNOSIS — I10 Essential (primary) hypertension: Secondary | ICD-10-CM | POA: Diagnosis not present

## 2022-05-23 DIAGNOSIS — J449 Chronic obstructive pulmonary disease, unspecified: Secondary | ICD-10-CM | POA: Diagnosis not present

## 2022-07-25 ENCOUNTER — Ambulatory Visit (INDEPENDENT_AMBULATORY_CARE_PROVIDER_SITE_OTHER): Payer: Medicare Other | Admitting: Gastroenterology

## 2022-08-14 DIAGNOSIS — Z23 Encounter for immunization: Secondary | ICD-10-CM | POA: Diagnosis not present

## 2022-09-25 ENCOUNTER — Ambulatory Visit (INDEPENDENT_AMBULATORY_CARE_PROVIDER_SITE_OTHER): Payer: Medicare Other | Admitting: Orthopedic Surgery

## 2022-09-25 ENCOUNTER — Encounter: Payer: Self-pay | Admitting: Orthopedic Surgery

## 2022-09-25 VITALS — Ht 72.0 in | Wt 252.0 lb

## 2022-09-25 DIAGNOSIS — M170 Bilateral primary osteoarthritis of knee: Secondary | ICD-10-CM

## 2022-09-26 DIAGNOSIS — M170 Bilateral primary osteoarthritis of knee: Secondary | ICD-10-CM | POA: Diagnosis not present

## 2022-09-26 MED ORDER — HYLAN G-F 20 16 MG/2ML IX SOSY
16.0000 mg | PREFILLED_SYRINGE | Freq: Once | INTRA_ARTICULAR | Status: AC
  Administered 2022-09-26: 16 mg via INTRA_ARTICULAR

## 2022-09-26 NOTE — Addendum Note (Signed)
Addended by: Elizabeth Sauer on: 09/26/2022 10:36 AM   Modules accepted: Orders

## 2022-09-26 NOTE — Progress Notes (Signed)
Orthopaedic Clinic Return  Assessment: Marvin Lawson is a 82 y.o. male with the following: Bilateral knee pain, osteoarthritis   Plan:  Patient presents for first injection in a series of Synvisc injections.  Continues to have pain.  Prior injections provided approximately 2 months of relief.  He will return in 1 week for the second injection.  This patient is diagnosed with osteoarthritis of the knee(s).    Radiographs show evidence of joint space narrowing, osteophytes, subchondral sclerosis and/or subchondral cysts.  This patient has knee pain which interferes with functional and activities of daily living.    This patient has experienced inadequate response, adverse effects and/or intolerance with conservative treatments such as acetaminophen, NSAIDS, topical creams, physical therapy or regular exercise, knee bracing and/or weight loss.   This patient has experienced inadequate response or has a contraindication to intra articular steroid injections for at least 3 months.   This patient is not scheduled to have a total knee replacement within 6 months of starting treatment with viscosupplementation.   Procedure note injection Right knee joint   Verbal consent was obtained to inject the right knee joint  Timeout was completed to confirm the site of injection.  The skin was prepped with alcohol and ethyl chloride was sprayed at the injection site.  A 21-gauge needle was used to inject hyaluronic acid into the right knee using an anterolateral approach.  There were no complications. A sterile bandage was applied.  Procedure note injection Left knee joint   Verbal consent was obtained to inject the right knee joint  Timeout was completed to confirm the site of injection.  The skin was prepped with alcohol and ethyl chloride was sprayed at the injection site.  A 21-gauge needle was used to inject hyaluronic acid into the right knee using an anterolateral approach.  There were no  complications. A sterile bandage was applied.    Follow-up: Return in about 1 week (around 10/02/2022).   Subjective:  Chief Complaint  Patient presents with   Injections    Bilat knee Synvisc #1    History of Present Illness: Marvin Lawson is a 82 y.o. male who returns to clinic for repeat evaluation of bilateral knee pain.  He has advanced osteoarthritis of both knees.  Prior steroid injection provided relief of his symptoms for a couple of months.  We did discuss proceeding with viscosupplementation, and he is interested.  His insurance approved these injections.  He is here for his first injection today.  Review of Systems: No fevers or chills No numbness or tingling No chest pain No shortness of breath No bowel or bladder dysfunction No GI distress No headaches   Objective: Ht 6' (1.829 m)   Wt 252 lb (114.3 kg)   BMI 34.18 kg/m   Physical Exam:  General: Alert and oriented. and No acute distress. Gait: Slow, steady gait.   Bilateral knees with neutral alignment overall.  Mild effusion.  10-15 degree flexion contracture of the right knee.  Just short of full extension of the left knee.  Flexion beyond 100 degrees.  Knees are stable to varus and valgus stress.  Tenderness to palpation along the medial joint line bilaterally.  IMAGING: I personally ordered and reviewed the following images:  No new imaging obtained today  Mordecai Rasmussen, MD 09/26/2022 8:19 AM

## 2022-10-02 ENCOUNTER — Ambulatory Visit (INDEPENDENT_AMBULATORY_CARE_PROVIDER_SITE_OTHER): Payer: Medicare Other | Admitting: Orthopedic Surgery

## 2022-10-02 ENCOUNTER — Encounter: Payer: Self-pay | Admitting: Orthopedic Surgery

## 2022-10-02 VITALS — Ht 72.0 in | Wt 252.0 lb

## 2022-10-02 DIAGNOSIS — M170 Bilateral primary osteoarthritis of knee: Secondary | ICD-10-CM | POA: Diagnosis not present

## 2022-10-03 NOTE — Progress Notes (Signed)
Orthopaedic Clinic Return  Assessment: Marvin Lawson is a 82 y.o. male with the following: Bilateral knee pain, osteoarthritis   Plan:  Mr. Patel returns to clinic for his second hyaluronic acid injection.  He notes some improvement.  No issues with the prior injection.  He is ready to proceed.  He will return in 1 week for his final injection.  This patient is diagnosed with osteoarthritis of the knee(s).    Radiographs show evidence of joint space narrowing, osteophytes, subchondral sclerosis and/or subchondral cysts.  This patient has knee pain which interferes with functional and activities of daily living.    This patient has experienced inadequate response, adverse effects and/or intolerance with conservative treatments such as acetaminophen, NSAIDS, topical creams, physical therapy or regular exercise, knee bracing and/or weight loss.   This patient has experienced inadequate response or has a contraindication to intra articular steroid injections for at least 3 months.   This patient is not scheduled to have a total knee replacement within 6 months of starting treatment with viscosupplementation.   Procedure note injection Right knee joint   Verbal consent was obtained to inject the right knee joint  Timeout was completed to confirm the site of injection.  The skin was prepped with alcohol and ethyl chloride was sprayed at the injection site.  A 21-gauge needle was used to inject hyaluronic acid into the right knee using an anterolateral approach.  There were no complications. A sterile bandage was applied.  Procedure note injection Left knee joint   Verbal consent was obtained to inject the right knee joint  Timeout was completed to confirm the site of injection.  The skin was prepped with alcohol and ethyl chloride was sprayed at the injection site.  A 21-gauge needle was used to inject hyaluronic acid into the right knee using an anterolateral approach.  There were  no complications. A sterile bandage was applied.    Follow-up: No follow-ups on file.   Subjective:  Chief Complaint  Patient presents with   Bilateral knee pain    History of Present Illness: Marvin Lawson is a 82 y.o. male who returns to clinic for repeat evaluation of bilateral knee pain.  He has bilateral knee pain.  First HA injection was a week ago.  He has done well.  He reports some improvement in his symptoms.  No concerns at this time.  Review of Systems: No fevers or chills No numbness or tingling No chest pain No shortness of breath No bowel or bladder dysfunction No GI distress No headaches   Objective: Ht 6' (1.829 m)   Wt 252 lb (114.3 kg)   BMI 34.18 kg/m   Physical Exam:  General: Alert and oriented. and No acute distress. Gait: Slow, steady gait.   Bilateral knees with neutral alignment overall.  Mild effusion.  10-15 degree flexion contracture of the right knee.  Just short of full extension of the left knee.  Flexion beyond 100 degrees.  Knees are stable to varus and valgus stress.  Tenderness to palpation along the medial joint line bilaterally.  IMAGING: I personally ordered and reviewed the following images:  No new imaging obtained today  Mordecai Rasmussen, MD 10/03/2022 4:06 PM

## 2022-10-03 NOTE — Patient Instructions (Signed)
Today on the current

## 2022-10-09 ENCOUNTER — Ambulatory Visit: Payer: Medicare Other | Admitting: Orthopedic Surgery

## 2022-10-16 ENCOUNTER — Ambulatory Visit (INDEPENDENT_AMBULATORY_CARE_PROVIDER_SITE_OTHER): Payer: 59 | Admitting: Orthopedic Surgery

## 2022-10-16 ENCOUNTER — Encounter: Payer: Self-pay | Admitting: Orthopedic Surgery

## 2022-10-16 VITALS — Ht 72.0 in | Wt 252.0 lb

## 2022-10-16 DIAGNOSIS — M170 Bilateral primary osteoarthritis of knee: Secondary | ICD-10-CM | POA: Diagnosis not present

## 2022-10-16 NOTE — Progress Notes (Signed)
Orthopaedic Clinic Return  Assessment: Marvin Lawson is a 82 y.o. male with the following: Bilateral knee pain, osteoarthritis   Plan:  Marvin Lawson returns to clinic for his final hyaluronic acid injection.  He notes improvement.  No issues with the prior injection.  Small amount of skin irritation surrounding the injection site, could be related to the Band-Aid, or the cold spray used.  He had another doctor's appointment 1 week ago, and had to reschedule.  He is ready to proceed.  He will return as needed.  He will contact the clinic if he has any concerns. This patient is diagnosed with osteoarthritis of the knee(s).    Radiographs show evidence of joint space narrowing, osteophytes, subchondral sclerosis and/or subchondral cysts.  This patient has knee pain which interferes with functional and activities of daily living.    This patient has experienced inadequate response, adverse effects and/or intolerance with conservative treatments such as acetaminophen, NSAIDS, topical creams, physical therapy or regular exercise, knee bracing and/or weight loss.   This patient has experienced inadequate response or has a contraindication to intra articular steroid injections for at least 3 months.   This patient is not scheduled to have a total knee replacement within 6 months of starting treatment with viscosupplementation.   Procedure note injection Right knee joint   Verbal consent was obtained to inject the right knee joint  Timeout was completed to confirm the site of injection.  The skin was prepped with alcohol and ethyl chloride was sprayed at the injection site.  A 21-gauge needle was used to inject hyaluronic acid into the right knee using an anterolateral approach.  There were no complications. A sterile bandage was applied.  Procedure note injection Left knee joint   Verbal consent was obtained to inject the right knee joint  Timeout was completed to confirm the site of  injection.  The skin was prepped with alcohol and ethyl chloride was sprayed at the injection site.  A 21-gauge needle was used to inject hyaluronic acid into the right knee using an anterolateral approach.  There were no complications. A sterile bandage was applied.    Follow-up: Return if symptoms worsen or fail to improve.   Subjective:  Chief Complaint  Patient presents with   Injections    Synvisc #3 Bilat knees  [facility] [address]-1 Lot [id number]    History of Present Illness: Marvin Lawson is a 82 y.o. male who returns to clinic for repeat evaluation of bilateral knee pain.  He continues to have some pain in both knees.  He states that the hyaluronic acid injections have improved his symptoms overall.  He does report stiffness in the morning.  We did not see him in clinic last week, as he had another appointment elsewhere.  No issues.  He has noticed some irritation around the injection site.  He thinks this is related to the Band-Aid.   Review of Systems: No fevers or chills No numbness or tingling No chest pain No shortness of breath No bowel or bladder dysfunction No GI distress No headaches   Objective: Ht 6' (1.829 m)   Wt 252 lb (114.3 kg)   BMI 34.18 kg/m   Physical Exam:  General: Alert and oriented. and No acute distress. Gait: Slow, steady gait.   Bilateral knees with neutral alignment overall.  Mild effusion.  10-15 degree flexion contracture of the right knee.  Just short of full extension of the left knee.  Flexion beyond 100 degrees.  Knees are stable to varus and valgus stress.  Tenderness to palpation along the medial joint line bilaterally.  Some superficial skin irritation is noted bilaterally, in a circular pattern around the injection site.  No fluctuance.  No tenderness specifically.  No drainage.  IMAGING: I personally ordered and reviewed the following images:  No new imaging obtained today  Mordecai Rasmussen,  MD 10/16/2022 9:58 AM

## 2022-11-29 DIAGNOSIS — E7800 Pure hypercholesterolemia, unspecified: Secondary | ICD-10-CM | POA: Diagnosis not present

## 2022-11-29 DIAGNOSIS — Z299 Encounter for prophylactic measures, unspecified: Secondary | ICD-10-CM | POA: Diagnosis not present

## 2022-11-29 DIAGNOSIS — I10 Essential (primary) hypertension: Secondary | ICD-10-CM | POA: Diagnosis not present

## 2022-11-29 DIAGNOSIS — L853 Xerosis cutis: Secondary | ICD-10-CM | POA: Diagnosis not present

## 2022-11-29 DIAGNOSIS — E611 Iron deficiency: Secondary | ICD-10-CM | POA: Diagnosis not present

## 2022-12-17 ENCOUNTER — Telehealth (INDEPENDENT_AMBULATORY_CARE_PROVIDER_SITE_OTHER): Payer: Self-pay | Admitting: *Deleted

## 2022-12-17 NOTE — Telephone Encounter (Signed)
Received vm from angelia from [facility] wanting to schedule delivery of med for patient and asked to call back on [phone]. I called patient because pt should be getting med delivered to his home and not our office. Patient's daughter Annamary Carolin states he has not been able to get humira in 6-7 months and not having any symptoms. Last seen by dr Laural Golden. Appt with dr Jenetta Downer was a no show on 07/25/22 and 04/25/22 no show. I advised he needs office visit and scheduler would reach out to them to schedule. She asked for an appt to be mailed to them.

## 2022-12-17 NOTE — Telephone Encounter (Signed)
Mitzie please mail an appt to patient. Daughter preferred to have it mailed.   Patient's daughter Annamary Carolin aware per chelsea - If he has been off the med that long, I would hold off on restarting until we see him.   She verbalized understanding.

## 2022-12-17 NOTE — Telephone Encounter (Signed)
Should patient call [facility] to have humira delivered if he has not taken in 6 -7 months or wait to come in office and discuss before starting back on med.

## 2023-01-08 DIAGNOSIS — Z299 Encounter for prophylactic measures, unspecified: Secondary | ICD-10-CM | POA: Diagnosis not present

## 2023-01-08 DIAGNOSIS — I480 Paroxysmal atrial fibrillation: Secondary | ICD-10-CM | POA: Diagnosis not present

## 2023-01-08 DIAGNOSIS — D509 Iron deficiency anemia, unspecified: Secondary | ICD-10-CM | POA: Diagnosis not present

## 2023-01-08 DIAGNOSIS — I700 Atherosclerosis of aorta: Secondary | ICD-10-CM | POA: Diagnosis not present

## 2023-01-08 DIAGNOSIS — Z7189 Other specified counseling: Secondary | ICD-10-CM | POA: Diagnosis not present

## 2023-01-08 DIAGNOSIS — I10 Essential (primary) hypertension: Secondary | ICD-10-CM | POA: Diagnosis not present

## 2023-01-08 DIAGNOSIS — J449 Chronic obstructive pulmonary disease, unspecified: Secondary | ICD-10-CM | POA: Diagnosis not present

## 2023-01-08 DIAGNOSIS — Z0000 Encounter for general adult medical examination without abnormal findings: Secondary | ICD-10-CM | POA: Diagnosis not present

## 2023-04-07 ENCOUNTER — Ambulatory Visit (INDEPENDENT_AMBULATORY_CARE_PROVIDER_SITE_OTHER): Payer: 59 | Admitting: Gastroenterology

## 2023-06-23 ENCOUNTER — Ambulatory Visit (INDEPENDENT_AMBULATORY_CARE_PROVIDER_SITE_OTHER): Payer: 59 | Admitting: Gastroenterology

## 2023-06-23 ENCOUNTER — Encounter (INDEPENDENT_AMBULATORY_CARE_PROVIDER_SITE_OTHER): Payer: Self-pay | Admitting: Gastroenterology

## 2023-06-23 VITALS — BP 121/64 | HR 88 | Temp 98.0°F | Ht 72.0 in | Wt 245.4 lb

## 2023-06-23 DIAGNOSIS — K51311 Ulcerative (chronic) rectosigmoiditis with rectal bleeding: Secondary | ICD-10-CM

## 2023-06-23 DIAGNOSIS — K625 Hemorrhage of anus and rectum: Secondary | ICD-10-CM

## 2023-06-23 NOTE — Patient Instructions (Signed)
Perform blood workup Schedule flexible sigmoidoscopy  If normal blood workup, will start again Humira

## 2023-06-23 NOTE — H&P (View-Only) (Signed)
Marvin Lawson, M.D. Gastroenterology & Hepatology [facility] Gastroenterology [address] [place], [place] [address]  Primary Care Physician: Marvin Peri, MD [address] [place] [address]  I will communicate my assessment and recommendations to the referring MD via EMR.  Problems: Distal ulcerative colitis, complicated by recurrent rectal bleeding  History of Present Illness: Marvin Lawson is a 82 y.o. male with past medical history of ulcerative colitis, COPD, depression, hypertension, GERD, prostate cancer, who presents for follow up of ulcerative colitis and rectal bleeding.  The patient was last seen on 01/22/2022 -patient was seeing Dr. Karilyn Cota for multiple years. At that time, the patient was continued on 6-MP and had mesalamine given. Based on notes, the patient has been treated in the past with oral mesalamine, 6-MP, budesonide and most recently on Humira.  Patient received his first Humira dose on 02/26/2022 but did not have any follow-up afterwards.  Per telephone encounter notes, the patient was not able to get any Humira for multiple months and he did not show to any follow-up appointment afterwards.  Patient comes to the appointment with his daughter, who reports that he has presented recurrent bleeding for at least the last 6 months. He may have up to 10 bowel movements, which can be either fresh blood or diarrhea. He has to wake up several times during the night to have a bowel movement. He has been having abdominal distention intermittently and also some diffuse abdominal pain.  He has had some nause and dry heaving, but no vomiting.  He is currently only taking Apriso 4 capsule every day. Not taking 6-MP.  The patient denies having any  fever, chills, melena, hematemesis, diarrhea, jaundice, pruritus. Has lost 15 lb since symptoms started.  Last Colonoscopy: 04/23/2017 - The proximal sigmoid colon, descending colon, splenic flexure, transverse  colon, hepatic flexure, ascending colon, cecum, appendiceal orifice and ileocecal valve are normal. - One 6 mm polyp in the mid sigmoid colon, removed with a hot snare. Resected and retrieved. - Congested, erythematous, eroded and ulcerated mucosa in the distal sigmoid colon. Biopsied. - One 4 mm polyp in the rectum, removed with a cold snare. Resected and retrieved. - Internal hemorrhoids.  Pathology showed 1 tubular adenoma and 1 hyperplastic polyp, colonic mucosa with crypt abscess and patchy ulceration.  Had a flexible sigmoidoscopy on 06/04/2019 which showed presence of Mayo 2 in the sigmoid colon.  Biopsies showed moderately active chronic colitis without granulomas.  Past Medical History: Past Medical History:  Diagnosis Date   Arthritis    COPD (chronic obstructive pulmonary disease) (HCC)    Depression    Essential hypertension    GERD (gastroesophageal reflux disease)    Hyperlipidemia    PAF (paroxysmal atrial fibrillation) (HCC)    Prostate cancer (HCC)    Rectal bleeding    UC (ulcerative colitis confined to rectum) (HCC)    Varicose veins of both lower extremities     Past Surgical History: Past Surgical History:  Procedure Laterality Date   BIOPSY  06/04/2019   Procedure: BIOPSY;  Surgeon: [name], MD;  [facility];  Service: Endoscopy;;  sigmoid   COLONOSCOPY N/A 04/23/2017   Procedure: COLONOSCOPY;  Surgeon: [name], MD;  [facility];  Service: Endoscopy;  Laterality: N/A;  7:30   FLEXIBLE SIGMOIDOSCOPY N/A 06/04/2019   Procedure: FLEXIBLE SIGMOIDOSCOPY WITH PROPOFOL;  Surgeon: [name], MD;  [facility];  Service: Endoscopy;  Laterality: N/A;  11:40   GASTRECTOMY     ulcers   PROSTATECTOMY      Family History: Family History  Problem Relation Age of Onset   Hypertension Mother     Social History: Social History   Tobacco Use  Smoking Status Former   Passive exposure: Past  Smokeless Tobacco  Never   Social History   Substance and Sexual Activity  Alcohol Use Not Currently   Social History   Substance and Sexual Activity  Drug Use No    Allergies: Allergies  Allergen Reactions   Prednisone Other (See Comments)    Makes real nervous   Tramadol Palpitations    Medications: Current Outpatient Medications  Medication Sig Dispense Refill   atorvastatin (LIPITOR) 10 MG tablet Take 10 mg by mouth at bedtime.      diltiazem (CARDIZEM CD) 180 MG 24 hr capsule Take 180 mg by mouth every evening.      Ergocalciferol (VITAMIN D2) 50 MCG (2000 UT) TABS Take 2,000 Units by mouth daily.     ferrous sulfate 325 (65 FE) MG tablet Take 325 mg by mouth daily at 12 noon.     furosemide (LASIX) 20 MG tablet Take 20 mg by mouth as needed.     lisinopril-hydrochlorothiazide (PRINZIDE,ZESTORETIC) 20-12.5 MG tablet Take 1 tablet by mouth at bedtime.      omeprazole (PRILOSEC) 40 MG capsule Take 40 mg by mouth every evening.      Adalimumab (HUMIRA PEN) 40 MG/0.4ML PNKT Inject 40mg  every 2 weeks. (Patient not taking: Reported on 06/23/2023) 2 each 11   Adalimumab (HUMIRA PEN) 80 MG/0.8ML PNKT Inject 80 mg day 1, day 2, and day 15 (Patient not taking: Reported on 06/23/2023) 3 each 0   mercaptopurine (PURINETHOL) 50 MG tablet Take 1 tablet (50 mg total) by mouth daily. Give on an empty stomach 1 hour before or 2 hours after meals. Caution: Chemotherapy. (Patient not taking: Reported on 06/23/2023) 30 tablet 5   mesalamine (APRISO) 0.375 g 24 hr capsule Take by mouth. Takes 4 capsules in the mornings (Patient not taking: Reported on 06/23/2023)     mirtazapine (REMERON) 30 MG tablet Take 30 mg by mouth at bedtime. (Patient not taking: Reported on 06/23/2023)     No current facility-administered medications for this visit.    Review of Systems: GENERAL: negative for malaise, night sweats HEENT: No changes in hearing or vision, no nose bleeds or other nasal problems. NECK: Negative for lumps,  goiter, pain and significant neck swelling RESPIRATORY: Negative for cough, wheezing CARDIOVASCULAR: Negative for chest pain, leg swelling, palpitations, orthopnea GI: SEE HPI MUSCULOSKELETAL: Negative for joint pain or swelling, back pain, and muscle pain. SKIN: Negative for lesions, rash PSYCH: Negative for sleep disturbance, mood disorder and recent psychosocial stressors. HEMATOLOGY Negative for prolonged bleeding, bruising easily, and swollen nodes. ENDOCRINE: Negative for cold or heat intolerance, polyuria, polydipsia and goiter. NEURO: negative for tremor, gait imbalance, syncope and seizures. The remainder of the review of systems is noncontributory.   Physical Exam: BP 121/64 (BP Location: Right Arm, Patient Position: Sitting, Cuff Size: Large)   Pulse 88   Temp 98 F (36.7 C) (Oral)   Ht 6' (1.829 m)   Wt 245 lb 6.4 oz (111.3 kg)   BMI 33.28 kg/m  GENERAL: The patient is AO x3, in no acute distress. HEENT: Head is normocephalic and atraumatic. EOMI are intact. Mouth is well hydrated and without lesions. NECK: Supple. No masses LUNGS: Clear to auscultation. No presence of  rhonchi/wheezing/rales. Adequate chest expansion HEART: RRR, normal s1 and s2. ABDOMEN: Tender to palpation in the right upper quadrant, no guarding, no peritoneal signs, and nondistended. BS +. No masses. EXTREMITIES: Without any cyanosis, clubbing, rash, lesions or edema. NEUROLOGIC: AOx3, no focal motor deficit. SKIN: no jaundice, no rashes  Imaging/Labs: as above  I personally reviewed and interpreted the available labs, imaging and endoscopic files.  Impression and Plan: Marvin Lawson is a 82 y.o. male with past medical history of ulcerative colitis, COPD, depression, hypertension, GERD, prostate cancer, who presents for follow up of ulcerative colitis and rectal bleeding.  Patient has presented recurrent rectal bleeding for several months along with abdominal pain episodes and diarrhea.   Patient has presented chronic history of ulcerative colitis.  It seems that he had clinical response with the use of Humira in the past but unfortunately there was a long break in his treatment.  He is currently off treatment and is very symptomatic.  We discussed in detail the importance of controlling his ulcerative colitis with medications.  He has failed 6-MP and mesalamine compounds.  Due to this, I discussed the importance of potentially trying again adalimumab.  I explained that there was a caveat in terms of the possibility of developing antibodies against adalimumab.  Will check surveillance labs, but also the presence of antibodies against adalimumab and TB/hepatitis B testing.  If these tests are normal, we will proceed with restarting adalimumab loading and maintenance dose every 2 weeks.  The patient was also advised to proceed with a flexible sigmoidoscopy to determine his baseline disease, he is agreeable to proceed with this.  -Check CBC, CMP, CRP, iron studies, hepatitis B surface antigen, QuantiFERON and adalimumab antibodies -Schedule flexible sigmoidoscopy  -If normal blood workup, will start again Humira -May consider a short prednisone taper depending on endoscopic findings and symptoms  All questions were answered.      Marvin Blazing, MD Gastroenterology and Hepatology [facility] Gastroenterology

## 2023-06-23 NOTE — Progress Notes (Signed)
Marvin Lawson, M.D. Gastroenterology & Hepatology [facility] Gastroenterology [address] [place], [place] [address]  Primary Care Physician: [name], MD [address] [place] [address]  I will communicate my assessment and recommendations to the referring MD via EMR.  Problems: Distal ulcerative colitis, complicated by recurrent rectal bleeding  History of Present Illness: Marvin Lawson is a 82 y.o. male with past medical history of ulcerative colitis, COPD, depression, hypertension, GERD, prostate cancer, who presents for follow up of ulcerative colitis and rectal bleeding.  The patient was last seen on 01/22/2022 -patient was seeing Dr. Karilyn Cota for multiple years. At that time, the patient was continued on 6-MP and had mesalamine given. Based on notes, the patient has been treated in the past with oral mesalamine, 6-MP, budesonide and most recently on Humira.  Patient received his first Humira dose on 02/26/2022 but did not have any follow-up afterwards.  Per telephone encounter notes, the patient was not able to get any Humira for multiple months and he did not show to any follow-up appointment afterwards.  Patient comes to the appointment with his daughter, who reports that he has presented recurrent bleeding for at least the last 6 months. He may have up to 10 bowel movements, which can be either fresh blood or diarrhea. He has to wake up several times during the night to have a bowel movement. He has been having abdominal distention intermittently and also some diffuse abdominal pain.  He has had some nause and dry heaving, but no vomiting.  He is currently only taking Apriso 4 capsule every day. Not taking 6-MP.  The patient denies having any  fever, chills, melena, hematemesis, diarrhea, jaundice, pruritus. Has lost 15 lb since symptoms started.  Last Colonoscopy: 04/23/2017 - The proximal sigmoid colon, descending colon, splenic flexure, transverse  colon, hepatic flexure, ascending colon, cecum, appendiceal orifice and ileocecal valve are normal. - One 6 mm polyp in the mid sigmoid colon, removed with a hot snare. Resected and retrieved. - Congested, erythematous, eroded and ulcerated mucosa in the distal sigmoid colon. Biopsied. - One 4 mm polyp in the rectum, removed with a cold snare. Resected and retrieved. - Internal hemorrhoids.  Pathology showed 1 tubular adenoma and 1 hyperplastic polyp, colonic mucosa with crypt abscess and patchy ulceration.  Had a flexible sigmoidoscopy on 06/04/2019 which showed presence of Mayo 2 in the sigmoid colon.  Biopsies showed moderately active chronic colitis without granulomas.  Past Medical History: Past Medical History:  Diagnosis Date   Arthritis    COPD (chronic obstructive pulmonary disease) (HCC)    Depression    Essential hypertension    GERD (gastroesophageal reflux disease)    Hyperlipidemia    PAF (paroxysmal atrial fibrillation) (HCC)    Prostate cancer (HCC)    Rectal bleeding    UC (ulcerative colitis confined to rectum) (HCC)    Varicose veins of both lower extremities     Past Surgical History: Past Surgical History:  Procedure Laterality Date   BIOPSY  06/04/2019   Procedure: BIOPSY;  Surgeon: [name], MD;  [facility];  Service: Endoscopy;;  sigmoid   COLONOSCOPY N/A 04/23/2017   Procedure: COLONOSCOPY;  Surgeon: [name], MD;  [facility];  Service: Endoscopy;  Laterality: N/A;  7:30   FLEXIBLE SIGMOIDOSCOPY N/A 06/04/2019   Procedure: FLEXIBLE SIGMOIDOSCOPY WITH PROPOFOL;  Surgeon: [name], MD;  [facility];  Service: Endoscopy;  Laterality: N/A;  11:40   GASTRECTOMY     ulcers   PROSTATECTOMY      Family History: Family History  Problem Relation Age of Onset   Hypertension Mother     Social History: Social History   Tobacco Use  Smoking Status Former   Passive exposure: Past  Smokeless Tobacco  Never   Social History   Substance and Sexual Activity  Alcohol Use Not Currently   Social History   Substance and Sexual Activity  Drug Use No    Allergies: Allergies  Allergen Reactions   Prednisone Other (See Comments)    Makes real nervous   Tramadol Palpitations    Medications: Current Outpatient Medications  Medication Sig Dispense Refill   atorvastatin (LIPITOR) 10 MG tablet Take 10 mg by mouth at bedtime.      diltiazem (CARDIZEM CD) 180 MG 24 hr capsule Take 180 mg by mouth every evening.      Ergocalciferol (VITAMIN D2) 50 MCG (2000 UT) TABS Take 2,000 Units by mouth daily.     ferrous sulfate 325 (65 FE) MG tablet Take 325 mg by mouth daily at 12 noon.     furosemide (LASIX) 20 MG tablet Take 20 mg by mouth as needed.     lisinopril-hydrochlorothiazide (PRINZIDE,ZESTORETIC) 20-12.5 MG tablet Take 1 tablet by mouth at bedtime.      omeprazole (PRILOSEC) 40 MG capsule Take 40 mg by mouth every evening.      Adalimumab (HUMIRA PEN) 40 MG/0.4ML PNKT Inject 40mg  every 2 weeks. (Patient not taking: Reported on 06/23/2023) 2 each 11   Adalimumab (HUMIRA PEN) 80 MG/0.8ML PNKT Inject 80 mg day 1, day 2, and day 15 (Patient not taking: Reported on 06/23/2023) 3 each 0   mercaptopurine (PURINETHOL) 50 MG tablet Take 1 tablet (50 mg total) by mouth daily. Give on an empty stomach 1 hour before or 2 hours after meals. Caution: Chemotherapy. (Patient not taking: Reported on 06/23/2023) 30 tablet 5   mesalamine (APRISO) 0.375 g 24 hr capsule Take by mouth. Takes 4 capsules in the mornings (Patient not taking: Reported on 06/23/2023)     mirtazapine (REMERON) 30 MG tablet Take 30 mg by mouth at bedtime. (Patient not taking: Reported on 06/23/2023)     No current facility-administered medications for this visit.    Review of Systems: GENERAL: negative for malaise, night sweats HEENT: No changes in hearing or vision, no nose bleeds or other nasal problems. NECK: Negative for lumps,  goiter, pain and significant neck swelling RESPIRATORY: Negative for cough, wheezing CARDIOVASCULAR: Negative for chest pain, leg swelling, palpitations, orthopnea GI: SEE HPI MUSCULOSKELETAL: Negative for joint pain or swelling, back pain, and muscle pain. SKIN: Negative for lesions, rash PSYCH: Negative for sleep disturbance, mood disorder and recent psychosocial stressors. HEMATOLOGY Negative for prolonged bleeding, bruising easily, and swollen nodes. ENDOCRINE: Negative for cold or heat intolerance, polyuria, polydipsia and goiter. NEURO: negative for tremor, gait imbalance, syncope and seizures. The remainder of the review of systems is noncontributory.   Physical Exam: BP 121/64 (BP Location: Right Arm, Patient Position: Sitting, Cuff Size: Large)   Pulse 88   Temp 98 F (36.7 C) (Oral)   Ht 6' (1.829 m)   Wt 245 lb 6.4 oz (111.3 kg)   BMI 33.28 kg/m  GENERAL: The patient is AO x3, in no acute distress. HEENT: Head is normocephalic and atraumatic. EOMI are intact. Mouth is well hydrated and without lesions. NECK: Supple. No masses LUNGS: Clear to auscultation. No presence of  rhonchi/wheezing/rales. Adequate chest expansion HEART: RRR, normal s1 and s2. ABDOMEN: Tender to palpation in the right upper quadrant, no guarding, no peritoneal signs, and nondistended. BS +. No masses. EXTREMITIES: Without any cyanosis, clubbing, rash, lesions or edema. NEUROLOGIC: AOx3, no focal motor deficit. SKIN: no jaundice, no rashes  Imaging/Labs: as above  I personally reviewed and interpreted the available labs, imaging and endoscopic files.  Impression and Plan: Marvin Lawson is a 82 y.o. male with past medical history of ulcerative colitis, COPD, depression, hypertension, GERD, prostate cancer, who presents for follow up of ulcerative colitis and rectal bleeding.  Patient has presented recurrent rectal bleeding for several months along with abdominal pain episodes and diarrhea.   Patient has presented chronic history of ulcerative colitis.  It seems that he had clinical response with the use of Humira in the past but unfortunately there was a long break in his treatment.  He is currently off treatment and is very symptomatic.  We discussed in detail the importance of controlling his ulcerative colitis with medications.  He has failed 6-MP and mesalamine compounds.  Due to this, I discussed the importance of potentially trying again adalimumab.  I explained that there was a caveat in terms of the possibility of developing antibodies against adalimumab.  Will check surveillance labs, but also the presence of antibodies against adalimumab and TB/hepatitis B testing.  If these tests are normal, we will proceed with restarting adalimumab loading and maintenance dose every 2 weeks.  The patient was also advised to proceed with a flexible sigmoidoscopy to determine his baseline disease, he is agreeable to proceed with this.  -Check CBC, CMP, CRP, iron studies, hepatitis B surface antigen, QuantiFERON and adalimumab antibodies -Schedule flexible sigmoidoscopy  -If normal blood workup, will start again Humira -May consider a short prednisone taper depending on endoscopic findings and symptoms  All questions were answered.      Marvin Blazing, MD Gastroenterology and Hepatology [facility] Gastroenterology

## 2023-06-24 ENCOUNTER — Telehealth (INDEPENDENT_AMBULATORY_CARE_PROVIDER_SITE_OTHER): Payer: Self-pay | Admitting: Gastroenterology

## 2023-06-24 NOTE — Telephone Encounter (Signed)
Left message on [name] (daughter) voicemail to return call regarding pre op appt. (06/30/23@1 :15pm)

## 2023-06-24 NOTE — Telephone Encounter (Signed)
[name] left voicemail returning call  Returned call to patient daughter Carmie Kanner and advised her of pre op.

## 2023-06-27 NOTE — Patient Instructions (Signed)
Marvin Lawson  06/27/2023     @PREFPERIOPPHARMACY @   Your procedure is scheduled on  07/01/2023.   Report to [facility] at  0900  A.M.   Call this number if you have problems the morning of surgery:  [phone]  If you experience any cold or flu symptoms such as cough, fever, chills, shortness of breath, etc. between now and your scheduled surgery, please notify us at the above number.   Remember:  Follow the diet and prep instructions given to you by the office.     Take these medicines the morning of surgery with A SIP OF WATER                                               Diltiazem.    Do not wear jewelry, make-up or nail polish, including gel polish,  artificial nails, or any other type of covering on natural nails (fingers and  toes).  Do not wear lotions, powders, or perfumes, or deodorant.  Do not shave 48 hours prior to surgery.  Men may shave face and neck.  Do not bring valuables to the hospital.  [facility] is not responsible for any belongings or valuables.  Contacts, dentures or bridgework may not be worn into surgery.  Leave your suitcase in the car.  After surgery it may be brought to your room.  For patients admitted to the hospital, discharge time will be determined by your treatment team.  Patients discharged the day of surgery will not be allowed to drive home and must have someone with them for 24 hours.    Special instructions:   DO NOT smoke tobacco or vape for 24 hours before your procedure.  Please read over the following fact sheets that you were given. Anesthesia Post-op Instructions and Care and Recovery After Surgery       Flexible Sigmoidoscopy, Care After The following information offers guidance on how to care for yourself after your procedure. Your health care provider may also give you more specific instructions. If you have problems or questions, contact your health care provider. What can I expect after the  procedure? After the procedure, it is common to have: Cramping or pain in your abdomen. Bloating. A small amount of blood with your stool. This may happen if a sample of tissue was removed for testing (biopsy). Follow these instructions at home: Eating and drinking Drink enough fluid to keep your urine pale yellow. Follow instructions from your health care provider about what you may eat and drink. Go back to your normal diet as told by your health care provider. Avoid heavy or fried foods. These may be hard to digest. Activity  If you were given a sedative during the procedure, it can affect you for several hours. Do not drive or operate machinery until your health care provider says that it is safe. Rest as told by your health care provider. Do not sit for a long time without moving. Get up to take short walks every 1-2 hours. This will improve blood flow and breathing. Ask for help if you feel weak or unsteady. Return to your normal activities as told by your health care provider. Ask your health care provider what activities are safe for you. General instructions Take over-the-counter and prescription medicines only as told by your health care  provider. Try walking around when you have cramps or feel bloated. Contact a health care provider if: You have pain or cramping in your abdomen that gets worse or is not helped with medicine. You have a small amount of bleeding from your rectum for more than 24 hours after your procedure. You have nausea or vomiting. You feel weak or dizzy. You have a fever. Get help right away if: You pass large blood clots or see a large amount of blood in the toilet after having a bowel movement. You have severe pain in your abdomen. This information is not intended to replace advice given to you by your health care provider. Make sure you discuss any questions you have with your health care provider. Document Revised: 02/13/2022 Document Reviewed:  02/13/2022 Elsevier Patient Education  2024 [employer] Monitored Anesthesia Care, Care After The following information offers guidance on how to care for yourself after your procedure. Your health care provider may also give you more specific instructions. If you have problems or questions, contact your health care provider. What can I expect after the procedure? After the procedure, it is common to have: Tiredness. Little or no memory about what happened during or after the procedure. Impaired judgment when it comes to making decisions. Nausea or vomiting. Some trouble with balance. Follow these instructions at home: For the time period you were told by your health care provider:  Rest. Do not participate in activities where you could fall or become injured. Do not drive or use machinery. Do not drink alcohol. Do not take sleeping pills or medicines that cause drowsiness. Do not make important decisions or sign legal documents. Do not take care of children on your own. Medicines Take over-the-counter and prescription medicines only as told by your health care provider. If you were prescribed antibiotics, take them as told by your health care provider. Do not stop using the antibiotic even if you start to feel better. Eating and drinking Follow instructions from your health care provider about what you may eat and drink. Drink enough fluid to keep your urine pale yellow. If you vomit: Drink clear fluids slowly and in small amounts as you are able. Clear fluids include water, ice chips, low-calorie sports drinks, and fruit juice that has water added to it (diluted fruit juice). Eat light and bland foods in small amounts as you are able. These foods include bananas, applesauce, rice, lean meats, toast, and crackers. General instructions  Have a responsible adult stay with you for the time you are told. It is important to have someone help care for you until you are awake and  alert. If you have sleep apnea, surgery and some medicines can increase your risk for breathing problems. Follow instructions from your health care provider about wearing your sleep device: When you are sleeping. This includes during daytime naps. While taking prescription pain medicines, sleeping medicines, or medicines that make you drowsy. Do not use any products that contain nicotine or tobacco. These products include cigarettes, chewing tobacco, and vaping devices, such as e-cigarettes. If you need help quitting, ask your health care provider. Contact a health care provider if: You feel nauseous or vomit every time you eat or drink. You feel light-headed. You are still sleepy or having trouble with balance after 24 hours. You get a rash. You have a fever. You have redness or swelling around the IV site. Get help right away if: You have trouble breathing. You have new confusion after you get  home. These symptoms may be an emergency. Get help right away. Call 911. Do not wait to see if the symptoms will go away. Do not drive yourself to the hospital. This information is not intended to replace advice given to you by your health care provider. Make sure you discuss any questions you have with your health care provider. Document Revised: 02/04/2022 Document Reviewed: 02/04/2022 Elsevier Patient Education  2024 [employer].

## 2023-06-30 ENCOUNTER — Encounter (INDEPENDENT_AMBULATORY_CARE_PROVIDER_SITE_OTHER): Payer: 59 | Admitting: Nurse Practitioner

## 2023-06-30 ENCOUNTER — Encounter: Payer: Self-pay | Admitting: Nurse Practitioner

## 2023-06-30 ENCOUNTER — Encounter (HOSPITAL_COMMUNITY): Payer: Self-pay

## 2023-06-30 ENCOUNTER — Telehealth (INDEPENDENT_AMBULATORY_CARE_PROVIDER_SITE_OTHER): Payer: Self-pay | Admitting: Gastroenterology

## 2023-06-30 ENCOUNTER — Encounter (HOSPITAL_COMMUNITY)
Admission: RE | Admit: 2023-06-30 | Discharge: 2023-06-30 | Disposition: A | Discharge status: Home / Self Care | Payer: 59 | Source: Ambulatory Visit | Attending: Gastroenterology | Admitting: Gastroenterology

## 2023-06-30 VITALS — BP 121/64 | HR 88 | Temp 98.0°F | Resp 18 | Ht 72.0 in | Wt 245.4 lb

## 2023-06-30 VITALS — BP 138/80 | HR 88 | Ht 74.0 in | Wt 254.2 lb

## 2023-06-30 DIAGNOSIS — K51911 Ulcerative colitis, unspecified with rectal bleeding: Secondary | ICD-10-CM | POA: Insufficient documentation

## 2023-06-30 DIAGNOSIS — Z79899 Other long term (current) drug therapy: Secondary | ICD-10-CM | POA: Diagnosis not present

## 2023-06-30 DIAGNOSIS — K51311 Ulcerative (chronic) rectosigmoiditis with rectal bleeding: Secondary | ICD-10-CM

## 2023-06-30 DIAGNOSIS — F32A Depression, unspecified: Secondary | ICD-10-CM | POA: Diagnosis not present

## 2023-06-30 DIAGNOSIS — I10 Essential (primary) hypertension: Secondary | ICD-10-CM | POA: Insufficient documentation

## 2023-06-30 DIAGNOSIS — Z7901 Long term (current) use of anticoagulants: Secondary | ICD-10-CM | POA: Diagnosis not present

## 2023-06-30 DIAGNOSIS — J449 Chronic obstructive pulmonary disease, unspecified: Secondary | ICD-10-CM | POA: Diagnosis not present

## 2023-06-30 DIAGNOSIS — E782 Mixed hyperlipidemia: Secondary | ICD-10-CM | POA: Diagnosis not present

## 2023-06-30 DIAGNOSIS — Z01810 Encounter for preprocedural cardiovascular examination: Secondary | ICD-10-CM | POA: Diagnosis not present

## 2023-06-30 DIAGNOSIS — E785 Hyperlipidemia, unspecified: Secondary | ICD-10-CM | POA: Insufficient documentation

## 2023-06-30 DIAGNOSIS — K219 Gastro-esophageal reflux disease without esophagitis: Secondary | ICD-10-CM | POA: Insufficient documentation

## 2023-06-30 DIAGNOSIS — I480 Paroxysmal atrial fibrillation: Secondary | ICD-10-CM | POA: Insufficient documentation

## 2023-06-30 DIAGNOSIS — Z23 Encounter for immunization: Secondary | ICD-10-CM | POA: Diagnosis not present

## 2023-06-30 NOTE — Progress Notes (Signed)
Patient presents to pre op with a reoccurrence of Atrial Fibrillation.  He is no longer taking his Xarelto and Aspirin due to bleeding issues.  We will proceed due to Afib being controlled but patient is to follow up with cardiology as soon as possible.  A copy of his EKG was sent with the patient.

## 2023-06-30 NOTE — Telephone Encounter (Signed)
Spoke with pt grand daughter and informed her that it is ok to proceed with procedure tomorrow.

## 2023-06-30 NOTE — Progress Notes (Signed)
Office Visit    Patient Name: Marvin Lawson Date of Encounter: 06/30/2023  Primary Care Provider:  [name], MD Primary Cardiologist:  Nona Dell, MD  Chief Complaint    82 y.o. male with a history of ulcerative colitis and recurrent GI bleeding, paroxysmal atrial fibrillation, hypertension, hyperlipidemia, GERD, depression, and COPD, who presents for follow-up due to recurrent atrial fibrillation and preoperative evaluation.  Past Medical History    Past Medical History:  Diagnosis Date   Arthritis    COPD (chronic obstructive pulmonary disease) (HCC)    Depression    Essential hypertension    GERD (gastroesophageal reflux disease)    History of echocardiogram    a. 04/2019 Echo: EF 55-60%, mild conc LVH, mildly dil LA, mild Ao sclerosis. Triv TR.   Hyperlipidemia    PAF (paroxysmal atrial fibrillation) (HCC)    a. CHA2DS2VASc = 3-->no OAC 2/2 recurrent GIB.   Prostate cancer (HCC)    Rectal bleeding    UC (ulcerative colitis confined to rectum) (HCC)    Varicose veins of both lower extremities    Past Surgical History:  Procedure Laterality Date   BIOPSY  06/04/2019   Procedure: BIOPSY;  Surgeon: [name], MD;  [facility]: [facility];  Service: Endoscopy;;  sigmoid   COLONOSCOPY N/A 04/23/2017   Procedure: COLONOSCOPY;  Surgeon: [name], MD;  Location: [facility];  Service: Endoscopy;  Laterality: N/A;  7:30   FLEXIBLE SIGMOIDOSCOPY N/A 06/04/2019   Procedure: FLEXIBLE SIGMOIDOSCOPY WITH PROPOFOL;  Surgeon: [name], MD;  Location: [facility];  Service: Endoscopy;  Laterality: N/A;  11:40   GASTRECTOMY     ulcers   PROSTATECTOMY      Allergies  Allergies  Allergen Reactions   Prednisone Other (See Comments)    Makes real nervous   Tramadol Palpitations    History of Present Illness      82 y.o. y/o male with above past medical history including ulcerative colitis and recurrent GI bleeding, paroxysmal atrial  fibrillation, hypertension, hyperlipidemia, GERD, depression, and COPD.  Prior echocardiogram in August 2020 showed an EF of 55 to 60% with mild concentric LVH and mild aortic sclerosis.  Per notes, he was diagnosed with atrial fibrillation approximately 6 years ago after drinking some strong whiskey and having a coughing spell.  He was subsequently placed on Xarelto but this was eventually discontinued due to lack of known recurrence of A-fib and also rectal bleeding in the setting of ulcerative colitis requiring blood transfusions.  He established care with Dr. [name] in July 2022 at which time he was offered referral for [facility] evaluation however, he declined.   Mr. Luten notes some degree of chronic dyspnea on exertion.  Over the past few years, he is continue to have intermittent rectal bleeding but beginning about 2 to 3 weeks ago, he had more profound bright red blood per rectum and has since been seen by his primary care doctor and gastroenterologist.  He had labs drawn on September 30 showing mild normocytic anemia with an H&H of 11.7 and 35.8 respectively.  He was scheduled for colonoscopy to occur tomorrow morning and went for evaluation today, was found to be irregular on examination.  ECG shows atrial fibrillation at 82 bpm.  From a cardiac perspective, patient denies palpitations, chest pain, presyncope, or syncope.  As noted, he has been having shortness of breath with activity over the past 2 to 3 weeks, which he attributes to bleeding.  He lives locally with his daughter and is able to walk a flight of stairs without symptoms or limitations.  He denies any prior history of chest pain.  Home Medications    Current Outpatient Medications  Medication Sig Dispense Refill   atorvastatin (LIPITOR) 10 MG tablet Take 10 mg by mouth at bedtime.      diltiazem (CARDIZEM CD) 180 MG 24 hr capsule Take 180 mg by mouth every evening.      Ergocalciferol (VITAMIN D2) 50 MCG (2000 UT) TABS Take  2,000 Units by mouth daily.     ferrous sulfate 325 (65 FE) MG tablet Take 325 mg by mouth daily at 12 noon.     furosemide (LASIX) 20 MG tablet Take 20 mg by mouth as needed.     lisinopril-hydrochlorothiazide (PRINZIDE,ZESTORETIC) 20-12.5 MG tablet Take 1 tablet by mouth at bedtime.      omeprazole (PRILOSEC) 40 MG capsule Take 40 mg by mouth every evening.      Adalimumab (HUMIRA PEN) 40 MG/0.4ML PNKT Inject 40mg  every 2 weeks. (Patient not taking: Reported on 06/23/2023) 2 each 11   Adalimumab (HUMIRA PEN) 80 MG/0.8ML PNKT Inject 80 mg day 1, day 2, and day 15 (Patient not taking: Reported on 06/23/2023) 3 each 0   mercaptopurine (PURINETHOL) 50 MG tablet Take 1 tablet (50 mg total) by mouth daily. Give on an empty stomach 1 hour before or 2 hours after meals. Caution: Chemotherapy. (Patient not taking: Reported on 06/23/2023) 30 tablet 5   mesalamine (APRISO) 0.375 g 24 hr capsule Take by mouth. Takes 4 capsules in the mornings (Patient not taking: Reported on 06/23/2023)     mirtazapine (REMERON) 30 MG tablet Take 30 mg by mouth at bedtime. (Patient not taking: Reported on 06/23/2023)     No current facility-administered medications for this visit.     Review of Systems    Chronic dyspnea on exertion which is worsened over the past 2 to 3 weeks in the setting of rectal bleeding.  He denies chest pain, palpitations, PND, orthopnea, dizziness, syncope, or early satiety.  He does experience mild lower extremity swelling above his socks.  All other systems reviewed and are otherwise negative except as noted above.    Physical Exam    VS:  BP 138/80 (BP Location: Right Arm, Patient Position: Sitting, Cuff Size: Normal)   Pulse 88   Ht 6\' 2"  (1.88 m)   Wt 254 lb 3.2 oz (115.3 kg)   SpO2 97%   BMI 32.64 kg/m  , BMI Body mass index is 32.64 kg/m.     GEN: Obese, in no acute distress. HEENT: normal. Neck: Supple, no JVD, carotid bruits, or masses. Cardiac: Irregularly irregular, no murmurs,  rubs, or gallops. No clubbing, cyanosis, 1+ bilateral lower extremity edema above his sock line.  Radials 2+/PT 2+ and equal bilaterally.  Respiratory:  Respirations regular and unlabored, clear to auscultation bilaterally. GI: Obese, protuberant, somewhat firm but nontender, BS + x 4. MS: no deformity or atrophy. Skin: warm and dry, no rash. Neuro:  Strength and sensation are intact. Psych: Normal affect.  Accessory Clinical Findings    ECG personally reviewed by me today -    Afib, 82, no acute ST/T changes - no acute changes.  Lab Results  Component Value Date   WBC 9.2 06/23/2023   HGB 11.7 (L) 06/23/2023   HCT 35.8 (L) 06/23/2023   MCV 98.4 06/23/2023   PLT 368 06/23/2023   Lab Results  Component Value  Date   CREATININE 1.44 (H) 06/23/2023   BUN 19 06/23/2023   NA 139 06/23/2023   K 4.5 06/23/2023   CL 108 06/23/2023   CO2 23 06/23/2023   Lab Results  Component Value Date   ALT 8 (L) 06/23/2023   AST 10 06/23/2023   ALKPHOS 80 04/02/2017   BILITOT 0.4 06/23/2023   Assessment & Plan    1.  Paroxysmal atrial fibrillation: Patient with prior history of A-fib dating back approximately 6 years ago.  He was briefly on anticoagulation but this was discontinued in the setting of rectal bleeding and ulcerative colitis as well as absence of recurrent A-fib.  Echo in 2020 showed normal LV function.  In the setting of recurrent rectal bleeding over the past 2 to 3 weeks with associated dyspnea, patient was evaluated today prior to planned colonoscopy which is scheduled for tomorrow, and was noted to be in rate controlled atrial fibrillation.  He denies any symptoms, though it is unclear to what extent A-fib may be playing a role in his dyspnea over the past 2 to 3 weeks.  He does not experience chest pain and denies palpitations.  CHA2DS2-VASc is at least 3 however, in the setting of active rectal bleeding with long history of recurrent rectal bleeding, he remains a poor candidate for  oral anticoagulation.  We discussed this in detail today.  He was willing to accept a referral for watchman evaluation with our electrophysiology team in [place].  As he is well rate controlled, continue current dose of diltiazem 180 mg daily.  He recently had blood work which was relatively stable.  I wished to check a TSH today however, he notes that he plans a follow-up lab work in a week or 2 with the primary care and wished to defer TSH to be drawn until then.  I will arrange for follow-up echocardiogram.  2.  Preoperative cardiovascular examination: Patient pending colonoscopy tomorrow morning in the setting of ulcerative colitis and recurrent rectal bleeding.  From a cardiac standpoint, he is able to achieve at least 5.5 METS without chest pain or dyspnea.  This places him at low risk for cardiac complications in the periprocedural timeframe and he may proceed without further ischemic evaluation at this time.  3.  Ulcerative colitis/rectal bleeding: Recent H&H relatively stable though down slightly from baseline.  Plan for colonoscopy tomorrow.  See #2.  Avoiding anticoagulation.  4.  Primary hypertension: Blood pressure relatively stable.  Continue current dose of diltiazem and lisinopril HCTZ.  5.  Hyperlipidemia: On atorvastatin therapy.  Recent normal LFTs.  Followed by primary care.  6.  Disposition: Follow-up TSH and echo.  Referral for Watchman.  Follow-up in clinic in 1 month or sooner if necessary.  Nicolasa Ducking, NP 06/30/2023, 4:52 PM

## 2023-06-30 NOTE — Patient Instructions (Signed)
Medication Instructions:  Your physician recommends that you continue on your current medications as directed. Please refer to the Current Medication list given to you today.  *If you need a refill on your cardiac medications before your next appointment, please call your pharmacy*   Lab Work: None If you have labs (blood work) drawn today and your tests are completely normal, you will receive your results only by: MyChart Message (if you have MyChart) OR A paper copy in the mail If you have any lab test that is abnormal or we need to change your treatment, we will call you to review the results.   Testing/Procedures: Your physician has requested that you have an echocardiogram. Echocardiography is a painless test that uses sound waves to create images of your heart. It provides your doctor with information about the size and shape of your heart and how well your heart's chambers and valves are working. This procedure takes approximately one hour. There are no restrictions for this procedure. Please do NOT wear cologne, perfume, aftershave, or lotions (deodorant is allowed). Please arrive 15 minutes prior to your appointment time.    Follow-Up: At [facility], you and your health needs are our priority.  As part of our continuing mission to provide you with exceptional heart care, we have created designated Provider Care Teams.  These Care Teams include your primary Cardiologist (physician) and Advanced Practice Providers (APPs -  Physician Assistants and Nurse Practitioners) who all work together to provide you with the care you need, when you need it.  We recommend signing up for the patient portal called "MyChart".  Sign up information is provided on this After Visit Summary.  MyChart is used to connect with patients for Virtual Visits (Telemedicine).  Patients are able to view lab/test results, encounter notes, upcoming appointments, etc.  Non-urgent messages can be sent to your  provider as well.   To learn more about what you can do with MyChart, go to [email or web address].    Your next appointment:   1 month(s)  Provider:   You may see Nona Dell, MD or one of the following Advanced Practice Providers on your designated Care Team:   Randall An, PA-C  Jacolyn Reedy, [place]     Other Instructions

## 2023-06-30 NOTE — Telephone Encounter (Signed)
Just spoke to pre op team.  They discussed again with the anesthesiologist (Dr. Johnnette Litter) and he is okay with proceeding with procedure tomorrow as he had a history of remote A-fib that was previously managed with anticoagulation.  He will still need to follow-up with cardiology but we can proceed with his procedure.

## 2023-06-30 NOTE — Telephone Encounter (Signed)
Pt daughter Carmie Kanner called in and states they are at heart care now. Pt had pre op today and was having fibrillation.

## 2023-07-01 ENCOUNTER — Ambulatory Visit (HOSPITAL_BASED_OUTPATIENT_CLINIC_OR_DEPARTMENT_OTHER): Payer: 59 | Admitting: Certified Registered"

## 2023-07-01 ENCOUNTER — Ambulatory Visit (HOSPITAL_COMMUNITY)
Admission: RE | Admit: 2023-07-01 | Discharge: 2023-07-01 | Disposition: A | Discharge status: Home / Self Care | Payer: 59 | Attending: Gastroenterology | Admitting: Gastroenterology

## 2023-07-01 ENCOUNTER — Ambulatory Visit (HOSPITAL_COMMUNITY): Payer: 59 | Admitting: Certified Registered"

## 2023-07-01 ENCOUNTER — Telehealth (INDEPENDENT_AMBULATORY_CARE_PROVIDER_SITE_OTHER): Payer: Self-pay | Admitting: *Deleted

## 2023-07-01 ENCOUNTER — Encounter (HOSPITAL_COMMUNITY): Payer: Self-pay | Admitting: Gastroenterology

## 2023-07-01 ENCOUNTER — Encounter (HOSPITAL_COMMUNITY)
Admission: RE | Disposition: A | Discharge status: Home / Self Care | Payer: Self-pay | Source: Home / Self Care | Attending: Gastroenterology

## 2023-07-01 DIAGNOSIS — K219 Gastro-esophageal reflux disease without esophagitis: Secondary | ICD-10-CM | POA: Diagnosis not present

## 2023-07-01 DIAGNOSIS — Z8546 Personal history of malignant neoplasm of prostate: Secondary | ICD-10-CM | POA: Diagnosis not present

## 2023-07-01 DIAGNOSIS — Z79620 Long term (current) use of immunosuppressive biologic: Secondary | ICD-10-CM | POA: Diagnosis not present

## 2023-07-01 DIAGNOSIS — K51911 Ulcerative colitis, unspecified with rectal bleeding: Secondary | ICD-10-CM | POA: Diagnosis not present

## 2023-07-01 DIAGNOSIS — F32A Depression, unspecified: Secondary | ICD-10-CM | POA: Insufficient documentation

## 2023-07-01 DIAGNOSIS — I10 Essential (primary) hypertension: Secondary | ICD-10-CM | POA: Diagnosis not present

## 2023-07-01 DIAGNOSIS — K625 Hemorrhage of anus and rectum: Secondary | ICD-10-CM | POA: Diagnosis present

## 2023-07-01 DIAGNOSIS — K648 Other hemorrhoids: Secondary | ICD-10-CM | POA: Diagnosis not present

## 2023-07-01 DIAGNOSIS — J449 Chronic obstructive pulmonary disease, unspecified: Secondary | ICD-10-CM | POA: Insufficient documentation

## 2023-07-01 DIAGNOSIS — K5190 Ulcerative colitis, unspecified, without complications: Secondary | ICD-10-CM

## 2023-07-01 DIAGNOSIS — I4891 Unspecified atrial fibrillation: Secondary | ICD-10-CM | POA: Diagnosis not present

## 2023-07-01 DIAGNOSIS — Z87891 Personal history of nicotine dependence: Secondary | ICD-10-CM | POA: Diagnosis not present

## 2023-07-01 DIAGNOSIS — K529 Noninfective gastroenteritis and colitis, unspecified: Secondary | ICD-10-CM

## 2023-07-01 DIAGNOSIS — Z79899 Other long term (current) drug therapy: Secondary | ICD-10-CM | POA: Insufficient documentation

## 2023-07-01 HISTORY — PX: FLEXIBLE SIGMOIDOSCOPY: SHX5431

## 2023-07-01 HISTORY — PX: BIOPSY: SHX5522

## 2023-07-01 SURGERY — SIGMOIDOSCOPY, FLEXIBLE
Anesthesia: General

## 2023-07-01 MED ORDER — LIDOCAINE HCL (CARDIAC) PF 100 MG/5ML IV SOSY
PREFILLED_SYRINGE | INTRAVENOUS | Status: DC | PRN
  Administered 2023-07-01: 50 mg via INTRAVENOUS

## 2023-07-01 MED ORDER — PHENYLEPHRINE 80 MCG/ML (10ML) SYRINGE FOR IV PUSH (FOR BLOOD PRESSURE SUPPORT)
PREFILLED_SYRINGE | INTRAVENOUS | Status: AC
  Filled 2023-07-01: qty 10

## 2023-07-01 MED ORDER — SODIUM CHLORIDE 0.9% FLUSH
INTRAVENOUS | Status: DC | PRN
Start: 2023-07-01 — End: 2023-07-01
  Administered 2023-07-01 (×4): 10 mL via INTRAVENOUS

## 2023-07-01 MED ORDER — PHENYLEPHRINE 80 MCG/ML (10ML) SYRINGE FOR IV PUSH (FOR BLOOD PRESSURE SUPPORT)
PREFILLED_SYRINGE | INTRAVENOUS | Status: DC | PRN
Start: 2023-07-01 — End: 2023-07-01
  Administered 2023-07-01 (×3): 160 ug via INTRAVENOUS

## 2023-07-01 MED ORDER — PREDNISONE 5 MG PO TABS
ORAL_TABLET | ORAL | 0 refills | Status: AC
Start: 2023-07-01 — End: 2023-08-10

## 2023-07-01 MED ORDER — PROPOFOL 10 MG/ML IV BOLUS
INTRAVENOUS | Status: DC | PRN
  Administered 2023-07-01: 100 mg via INTRAVENOUS

## 2023-07-01 NOTE — Anesthesia Preprocedure Evaluation (Signed)
Anesthesia Evaluation  Patient identified by MRN, date of birth, ID band Patient awake    Reviewed: Allergy & Precautions, H&P , NPO status , Patient's Chart, lab work & pertinent test results, reviewed documented beta blocker date and time   Airway Mallampati: II  TM Distance: >3 FB Neck ROM: full    Dental no notable dental hx.    Pulmonary neg pulmonary ROS, COPD, former smoker   Pulmonary exam normal breath sounds clear to auscultation       Cardiovascular Exercise Tolerance: Good hypertension, + dysrhythmias Atrial Fibrillation  Rhythm:regular Rate:Normal     Neuro/Psych  PSYCHIATRIC DISORDERS  Depression    negative neurological ROS  negative psych ROS   GI/Hepatic negative GI ROS, Neg liver ROS, PUD,GERD  ,,  Endo/Other  negative endocrine ROS    Renal/GU Renal diseasenegative Renal ROS  negative genitourinary   Musculoskeletal   Abdominal   Peds  Hematology negative hematology ROS (+)   Anesthesia Other Findings   Reproductive/Obstetrics negative OB ROS                             Anesthesia Physical Anesthesia Plan  ASA: 3  Anesthesia Plan: General   Post-op Pain Management:    Induction:   PONV Risk Score and Plan: Propofol infusion  Airway Management Planned:   Additional Equipment:   Intra-op Plan:   Post-operative Plan:   Informed Consent: I have reviewed the patients History and Physical, chart, labs and discussed the procedure including the risks, benefits and alternatives for the proposed anesthesia with the patient or authorized representative who has indicated his/her understanding and acceptance.     Dental Advisory Given  Plan Discussed with: CRNA  Anesthesia Plan Comments:        Anesthesia Quick Evaluation

## 2023-07-01 NOTE — Discharge Instructions (Signed)
You are being discharged to home.  Resume your previous diet.  We are waiting for your pathology results.  Prednisone taper Will restart Humira, if no presence of antibodies are detected.

## 2023-07-01 NOTE — Interval H&P Note (Signed)
History and Physical Interval Note:  07/01/2023 10:11 AM  Marvin Lawson  has presented today for surgery, with the diagnosis of Ulcerative colitis.  The various methods of treatment have been discussed with the patient and family. After consideration of risks, benefits and other options for treatment, the patient has consented to  Procedure(s) with comments: FLEXIBLE SIGMOIDOSCOPY (N/A) - 11:00am;asa 3 as a surgical intervention.  The patient's history has been reviewed, patient examined, no change in status, stable for surgery.  I have reviewed the patient's chart and labs.  Questions were answered to the patient's satisfaction.     [name] [place]

## 2023-07-01 NOTE — Op Note (Addendum)
[facility] Patient Name: Marvin Lawson Procedure Date: 07/01/2023 9:44 AM MRN: [id number] Date of Birth: 01/28/1941 Attending MD: [name] , , [id number] CSN: [id number] Age: 82 Admit Type: Outpatient Procedure:                Flexible Sigmoidoscopy Indications:              Hematochezia, Follow-up of ulcerative colitis Providers:                [name], Angelica Ran, Zena Amos Referring MD:              Medicines:                Monitored Anesthesia Care Complications:            No immediate complications. Estimated Blood Loss:     Estimated blood loss: none. Procedure:                Pre-Anesthesia Assessment:                           - Prior to the procedure, a History and Physical                            was performed, and patient medications, allergies                            and sensitivities were reviewed. The patient's                            tolerance of previous anesthesia was reviewed.                           - The risks and benefits of the procedure and the                            sedation options and risks were discussed with the                            patient. All questions were answered and informed                            consent was obtained.                           - ASA Grade Assessment: III - A patient with severe                            systemic disease.                           After obtaining informed consent, the scope was                            passed under direct vision. The [id number]-HQ190L                            ([id number]) scope was introduced  through the anus and                            advanced to the the descending colon. The flexible                            sigmoidoscopy was accomplished without difficulty.                            The patient tolerated the procedure well. The                            quality of the bowel preparation was good. Scope In: 10:23:53 AM Scope Out:  10:26:30 AM Total Procedure Duration: 0 hours 2 minutes 37 seconds  Findings:      The perianal and digital rectal examinations were normal.      Inflammation was found in a continuous and circumferential pattern from       the rectum to the descending colon. This was graded as Mayo Score 3       (severe, with spontaneous bleeding, ulcerations), and when compared to       the previous examination, the findings are worsened. Biopsies were taken       with a cold forceps for histology. Impression:               - Severe (Mayo Score 3) ulcerative colitis,                            worsened since the last examination. Biopsied. Moderate Sedation:      Per Anesthesia Care Recommendation:           - Discharge patient to home (ambulatory).                           - Resume previous diet.                           - Await pathology results.                           - Prednisone taper                           - Will restart Humira, if no presence of antibodies                            are detected.                           - Will refer to infectious disease to obtain                            clearance prior to starting Humira. Procedure Code(s):        --- Professional ---                           [phone], Sigmoidoscopy, flexible; with biopsy, single  or multiple Diagnosis Code(s):        --- Professional ---                           K51.90, Ulcerative colitis, unspecified, without                            complications                           K92.1, Melena (includes Hematochezia) CPT copyright 2022 American Medical Association. All rights reserved. The codes documented in this report are preliminary and upon coder review may  be revised to meet current compliance requirements. [name], MD [name],  07/01/2023 10:39:07 AM This report has been signed electronically. Number of Addenda: 0

## 2023-07-01 NOTE — Telephone Encounter (Signed)
-----   Message from [name] Mayorga sent at 07/01/2023 10:50 AM EDT ----- Casandra Doffing, can you please refer the patient to infectious disease specialist ASAP? 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

## 2023-07-01 NOTE — Telephone Encounter (Signed)
Referral sent as Urgent, they will contact patient with apt

## 2023-07-01 NOTE — Anesthesia Procedure Notes (Signed)
Date/Time: 07/01/2023 10:17 AM  Performed by: Julian Reil, CRNAPre-anesthesia Checklist: Patient identified, Emergency Drugs available, Suction available and Patient being monitored Patient Re-evaluated:Patient Re-evaluated prior to induction Oxygen Delivery Method: Nasal cannula Induction Type: IV induction Placement Confirmation: positive ETCO2

## 2023-07-01 NOTE — Interval H&P Note (Signed)
History and Physical Interval Note:  07/01/2023 8:48 AM  Marvin Lawson  has presented today for surgery, with the diagnosis of Ulcerative colitis.  The various methods of treatment have been discussed with the patient and family. After consideration of risks, benefits and other options for treatment, the patient has consented to  Procedure(s) with comments: FLEXIBLE SIGMOIDOSCOPY (N/A) - 11:00am;asa 3 as a surgical intervention.  The patient's history has been reviewed, patient examined, no change in status, stable for surgery.  I have reviewed the patient's chart and labs.  Questions were answered to the patient's satisfaction.     [name] Mayorga

## 2023-07-01 NOTE — Transfer of Care (Signed)
Immediate Anesthesia Transfer of Care Note  Patient: Marvin Lawson  Procedure(s) Performed: FLEXIBLE SIGMOIDOSCOPY BIOPSY  Patient Location: Short Stay  Anesthesia Type:General  Level of Consciousness: awake, alert , and oriented  Airway & Oxygen Therapy: Patient Spontanous Breathing  Post-op Assessment: Report given to RN and Post -op Vital signs reviewed and stable  Post vital signs: Reviewed and stable  Last Vitals:  Vitals Value Taken Time  BP    Temp    Pulse    Resp    SpO2      Last Pain:  Vitals:   07/01/23 1017  PainSc: 0-No pain         Complications: No notable events documented.

## 2023-07-02 LAB — SURGICAL PATHOLOGY

## 2023-07-03 ENCOUNTER — Encounter (INDEPENDENT_AMBULATORY_CARE_PROVIDER_SITE_OTHER): Payer: Self-pay | Admitting: *Deleted

## 2023-07-04 NOTE — Anesthesia Postprocedure Evaluation (Signed)
Anesthesia Post Note  Patient: Marvin Lawson  Procedure(s) Performed: FLEXIBLE SIGMOIDOSCOPY BIOPSY  Patient location during evaluation: Phase II Anesthesia Type: General Level of consciousness: awake Pain management: pain level controlled Vital Signs Assessment: post-procedure vital signs reviewed and stable Respiratory status: spontaneous breathing and respiratory function stable Cardiovascular status: blood pressure returned to baseline and stable Postop Assessment: no headache and no apparent nausea or vomiting Anesthetic complications: no Comments: Late entry   No notable events documented.   Last Vitals:  Vitals:   07/01/23 0914 07/01/23 1030  BP: 132/67 (!) 96/44  Pulse: 79 84  Resp: 18 (!) 21  Temp: 36.8 C 36.5 C  SpO2: 99% 99%    Last Pain:  Vitals:   07/02/23 1501  TempSrc:   PainSc: 0-No pain                 Windell Norfolk

## 2023-07-05 LAB — CBC WITH DIFFERENTIAL/PLATELET
Absolute Monocytes: 929 {cells}/uL (ref 200–950)
Basophils Absolute: 83 {cells}/uL (ref 0–200)
Basophils Relative: 0.9 %
Eosinophils Absolute: 156 {cells}/uL (ref 15–500)
Eosinophils Relative: 1.7 %
HCT: 35.8 % — ABNORMAL LOW (ref 38.5–50.0)
Hemoglobin: 11.7 g/dL — ABNORMAL LOW (ref 13.2–17.1)
Lymphs Abs: 1638 {cells}/uL (ref 850–3900)
MCH: 32.1 pg (ref 27.0–33.0)
MCHC: 32.7 g/dL (ref 32.0–36.0)
MCV: 98.4 fL (ref 80.0–100.0)
MPV: 10 fL (ref 7.5–12.5)
Monocytes Relative: 10.1 %
Neutro Abs: 6394 {cells}/uL (ref 1500–7800)
Neutrophils Relative %: 69.5 %
Platelets: 368 10*3/uL (ref 140–400)
RBC: 3.64 10*6/uL — ABNORMAL LOW (ref 4.20–5.80)
RDW: 11.4 % (ref 11.0–15.0)
Total Lymphocyte: 17.8 %
WBC: 9.2 10*3/uL (ref 3.8–10.8)

## 2023-07-05 LAB — COMPREHENSIVE METABOLIC PANEL
AG Ratio: 1.2 (calc) (ref 1.0–2.5)
ALT: 8 U/L — ABNORMAL LOW (ref 9–46)
AST: 10 U/L (ref 10–35)
Albumin: 3.7 g/dL (ref 3.6–5.1)
Alkaline phosphatase (APISO): 103 U/L (ref 35–144)
BUN/Creatinine Ratio: 13 (calc) (ref 6–22)
BUN: 19 mg/dL (ref 7–25)
CO2: 23 mmol/L (ref 20–32)
Calcium: 9.1 mg/dL (ref 8.6–10.3)
Chloride: 108 mmol/L (ref 98–110)
Creat: 1.44 mg/dL — ABNORMAL HIGH (ref 0.70–1.22)
Globulin: 3 g/dL (ref 1.9–3.7)
Glucose, Bld: 158 mg/dL — ABNORMAL HIGH (ref 65–99)
Potassium: 4.5 mmol/L (ref 3.5–5.3)
Sodium: 139 mmol/L (ref 135–146)
Total Bilirubin: 0.4 mg/dL (ref 0.2–1.2)
Total Protein: 6.7 g/dL (ref 6.1–8.1)

## 2023-07-05 LAB — ADALIMUMAB DRUG LEVEL AND ANTI-DRUG AB FOR RHEUMATIC DISEASE
ADALIMUMAB AB,RHEUM: <10 [AU] (ref <10)
ADALIMUMAB LEVEL, RHEUM: <0.8 ug/mL

## 2023-07-05 LAB — QUANTIFERON-TB GOLD PLUS
Mitogen-NIL: >10 [IU]/mL
NIL: 0.02 [IU]/mL
QuantiFERON-TB Gold Plus: POSITIVE — AB
TB1-NIL: >10 [IU]/mL
TB2-NIL: 7.85 [IU]/mL

## 2023-07-05 LAB — IRON,TIBC AND FERRITIN PANEL
%SAT: 21 % (ref 20–48)
Ferritin: 62 ng/mL (ref 24–380)
Iron: 52 ug/dL (ref 50–180)
TIBC: 243 ug/dL — ABNORMAL LOW (ref 250–425)

## 2023-07-05 LAB — HEPATITIS B SURFACE ANTIGEN: Hepatitis B Surface Ag: NONREACTIVE

## 2023-07-05 LAB — C-REACTIVE PROTEIN: CRP: 9.2 mg/L — ABNORMAL HIGH (ref <8.0)

## 2023-07-07 ENCOUNTER — Other Ambulatory Visit: Payer: Self-pay

## 2023-07-07 ENCOUNTER — Telehealth: Payer: Self-pay | Admitting: *Deleted

## 2023-07-07 ENCOUNTER — Emergency Department (HOSPITAL_COMMUNITY): Payer: 59

## 2023-07-07 ENCOUNTER — Inpatient Hospital Stay (HOSPITAL_COMMUNITY)
Admission: EM | Admit: 2023-07-07 | Discharge: 2023-07-12 | DRG: 872 | Disposition: A | Discharge status: Home / Self Care | Payer: 59 | Attending: Internal Medicine | Admitting: Internal Medicine

## 2023-07-07 ENCOUNTER — Encounter (HOSPITAL_COMMUNITY): Payer: Self-pay | Admitting: *Deleted

## 2023-07-07 DIAGNOSIS — R600 Localized edema: Secondary | ICD-10-CM | POA: Diagnosis not present

## 2023-07-07 DIAGNOSIS — I10 Essential (primary) hypertension: Secondary | ICD-10-CM | POA: Diagnosis not present

## 2023-07-07 DIAGNOSIS — L03113 Cellulitis of right upper limb: Secondary | ICD-10-CM | POA: Diagnosis present

## 2023-07-07 DIAGNOSIS — Z791 Long term (current) use of non-steroidal anti-inflammatories (NSAID): Secondary | ICD-10-CM | POA: Diagnosis not present

## 2023-07-07 DIAGNOSIS — Z8546 Personal history of malignant neoplasm of prostate: Secondary | ICD-10-CM

## 2023-07-07 DIAGNOSIS — I129 Hypertensive chronic kidney disease with stage 1 through stage 4 chronic kidney disease, or unspecified chronic kidney disease: Secondary | ICD-10-CM | POA: Diagnosis present

## 2023-07-07 DIAGNOSIS — F32A Depression, unspecified: Secondary | ICD-10-CM | POA: Diagnosis present

## 2023-07-07 DIAGNOSIS — Z6832 Body mass index (BMI) 32.0-32.9, adult: Secondary | ICD-10-CM

## 2023-07-07 DIAGNOSIS — M7989 Other specified soft tissue disorders: Secondary | ICD-10-CM | POA: Diagnosis not present

## 2023-07-07 DIAGNOSIS — Z87891 Personal history of nicotine dependence: Secondary | ICD-10-CM

## 2023-07-07 DIAGNOSIS — Z885 Allergy status to narcotic agent status: Secondary | ICD-10-CM

## 2023-07-07 DIAGNOSIS — D649 Anemia, unspecified: Secondary | ICD-10-CM | POA: Diagnosis present

## 2023-07-07 DIAGNOSIS — I480 Paroxysmal atrial fibrillation: Secondary | ICD-10-CM | POA: Diagnosis present

## 2023-07-07 DIAGNOSIS — Z8249 Family history of ischemic heart disease and other diseases of the circulatory system: Secondary | ICD-10-CM | POA: Diagnosis not present

## 2023-07-07 DIAGNOSIS — K51911 Ulcerative colitis, unspecified with rectal bleeding: Secondary | ICD-10-CM | POA: Diagnosis present

## 2023-07-07 DIAGNOSIS — Z794 Long term (current) use of insulin: Secondary | ICD-10-CM | POA: Diagnosis not present

## 2023-07-07 DIAGNOSIS — K51311 Ulcerative (chronic) rectosigmoiditis with rectal bleeding: Secondary | ICD-10-CM | POA: Diagnosis not present

## 2023-07-07 DIAGNOSIS — Z888 Allergy status to other drugs, medicaments and biological substances status: Secondary | ICD-10-CM

## 2023-07-07 DIAGNOSIS — J449 Chronic obstructive pulmonary disease, unspecified: Secondary | ICD-10-CM | POA: Diagnosis present

## 2023-07-07 DIAGNOSIS — R739 Hyperglycemia, unspecified: Secondary | ICD-10-CM | POA: Diagnosis present

## 2023-07-07 DIAGNOSIS — N1831 Chronic kidney disease, stage 3a: Secondary | ICD-10-CM | POA: Diagnosis present

## 2023-07-07 DIAGNOSIS — E785 Hyperlipidemia, unspecified: Secondary | ICD-10-CM | POA: Diagnosis present

## 2023-07-07 DIAGNOSIS — K219 Gastro-esophageal reflux disease without esophagitis: Secondary | ICD-10-CM | POA: Diagnosis present

## 2023-07-07 DIAGNOSIS — M65941 Unspecified synovitis and tenosynovitis, right hand: Secondary | ICD-10-CM | POA: Diagnosis not present

## 2023-07-07 DIAGNOSIS — H9190 Unspecified hearing loss, unspecified ear: Secondary | ICD-10-CM | POA: Diagnosis present

## 2023-07-07 DIAGNOSIS — M79641 Pain in right hand: Secondary | ICD-10-CM | POA: Diagnosis not present

## 2023-07-07 DIAGNOSIS — N183 Chronic kidney disease, stage 3 unspecified: Secondary | ICD-10-CM | POA: Diagnosis present

## 2023-07-07 DIAGNOSIS — E871 Hypo-osmolality and hyponatremia: Secondary | ICD-10-CM | POA: Diagnosis present

## 2023-07-07 DIAGNOSIS — K5190 Ulcerative colitis, unspecified, without complications: Secondary | ICD-10-CM | POA: Diagnosis present

## 2023-07-07 DIAGNOSIS — E669 Obesity, unspecified: Secondary | ICD-10-CM | POA: Diagnosis present

## 2023-07-07 DIAGNOSIS — T380X5A Adverse effect of glucocorticoids and synthetic analogues, initial encounter: Secondary | ICD-10-CM | POA: Diagnosis present

## 2023-07-07 DIAGNOSIS — A419 Sepsis, unspecified organism: Principal | ICD-10-CM | POA: Diagnosis present

## 2023-07-07 LAB — BASIC METABOLIC PANEL
Anion gap: 12 (ref 5–15)
BUN: 23 mg/dL (ref 8–23)
CO2: 19 mmol/L — ABNORMAL LOW (ref 22–32)
Calcium: 8.1 mg/dL — ABNORMAL LOW (ref 8.9–10.3)
Chloride: 102 mmol/L (ref 98–111)
Creatinine, Ser: 1.44 mg/dL — ABNORMAL HIGH (ref 0.61–1.24)
GFR, Estimated: 49 mL/min — ABNORMAL LOW (ref >60)
Glucose, Bld: 284 mg/dL — ABNORMAL HIGH (ref 70–99)
Potassium: 3.3 mmol/L — ABNORMAL LOW (ref 3.5–5.1)
Sodium: 133 mmol/L — ABNORMAL LOW (ref 135–145)

## 2023-07-07 LAB — CBC WITH DIFFERENTIAL/PLATELET
Abs Immature Granulocytes: 0.15 10*3/uL — ABNORMAL HIGH (ref 0.00–0.07)
Basophils Absolute: 0.1 10*3/uL (ref 0.0–0.1)
Basophils Relative: 1 %
Eosinophils Absolute: 0.1 10*3/uL (ref 0.0–0.5)
Eosinophils Relative: 1 %
HCT: 35.9 % — ABNORMAL LOW (ref 39.0–52.0)
Hemoglobin: 11.6 g/dL — ABNORMAL LOW (ref 13.0–17.0)
Immature Granulocytes: 1 %
Lymphocytes Relative: 15 %
Lymphs Abs: 2.6 10*3/uL (ref 0.7–4.0)
MCH: 31.8 pg (ref 26.0–34.0)
MCHC: 32.3 g/dL (ref 30.0–36.0)
MCV: 98.4 fL (ref 80.0–100.0)
Monocytes Absolute: 1.9 10*3/uL — ABNORMAL HIGH (ref 0.1–1.0)
Monocytes Relative: 10 %
Neutro Abs: 13.2 10*3/uL — ABNORMAL HIGH (ref 1.7–7.7)
Neutrophils Relative %: 72 %
Platelets: 342 10*3/uL (ref 150–400)
RBC: 3.65 MIL/uL — ABNORMAL LOW (ref 4.22–5.81)
RDW: 12.2 % (ref 11.5–15.5)
WBC: 18 10*3/uL — ABNORMAL HIGH (ref 4.0–10.5)
nRBC: 0 % (ref 0.0–0.2)

## 2023-07-07 LAB — LACTIC ACID, PLASMA: Lactic Acid, Venous: 1.5 mmol/L (ref 0.5–1.9)

## 2023-07-07 MED ORDER — ONDANSETRON HCL 4 MG PO TABS: 4.0000 mg | ORAL_TABLET | Freq: Four times a day (QID) | ORAL | Status: DC | PRN

## 2023-07-07 MED ORDER — LISINOPRIL-HYDROCHLOROTHIAZIDE 20-12.5 MG PO TABS: 1.0000 | ORAL_TABLET | Freq: Every day | ORAL | Status: DC

## 2023-07-07 MED ORDER — PREDNISONE 20 MG PO TABS: 20.0000 mg | ORAL_TABLET | Freq: Every day | ORAL | Status: DC

## 2023-07-07 MED ORDER — PREDNISONE 5 MG PO TABS
35.0000 mg | ORAL_TABLET | Freq: Every day | ORAL | Status: AC
  Administered 2023-07-10: 35 mg via ORAL
  Filled 2023-07-07 (×4): qty 1

## 2023-07-07 MED ORDER — LOPERAMIDE HCL 2 MG PO CAPS: 2.0000 mg | ORAL_CAPSULE | ORAL | Status: DC | PRN

## 2023-07-07 MED ORDER — HYDROCHLOROTHIAZIDE 12.5 MG PO TABS
12.5000 mg | ORAL_TABLET | Freq: Every day | ORAL | Status: DC
  Administered 2023-07-07 – 2023-07-08 (×2): 12.5 mg via ORAL
  Filled 2023-07-07 (×2): qty 1

## 2023-07-07 MED ORDER — SODIUM CHLORIDE 0.9 % IV SOLN
2.0000 g | INTRAVENOUS | Status: DC
  Administered 2023-07-08 – 2023-07-11 (×5): 2 g via INTRAVENOUS
  Filled 2023-07-07 (×5): qty 20

## 2023-07-07 MED ORDER — PREDNISONE 20 MG PO TABS: 10.0000 mg | ORAL_TABLET | Freq: Every day | ORAL | Status: DC

## 2023-07-07 MED ORDER — CEFAZOLIN SODIUM-DEXTROSE 2-4 GM/100ML-% IV SOLN
2.0000 g | Freq: Once | INTRAVENOUS | Status: AC
  Administered 2023-07-07: 2 g via INTRAVENOUS
  Filled 2023-07-07: qty 100

## 2023-07-07 MED ORDER — VANCOMYCIN HCL 1500 MG/300ML IV SOLN: 1500.0000 mg | Freq: Once | INTRAVENOUS | Status: DC

## 2023-07-07 MED ORDER — VANCOMYCIN HCL IN DEXTROSE 1-5 GM/200ML-% IV SOLN
1000.0000 mg | Freq: Once | INTRAVENOUS | Status: AC
  Administered 2023-07-07: 1000 mg via INTRAVENOUS
  Filled 2023-07-07: qty 200

## 2023-07-07 MED ORDER — VANCOMYCIN HCL 1250 MG/250ML IV SOLN
1250.0000 mg | INTRAVENOUS | Status: DC
  Administered 2023-07-08: 1250 mg via INTRAVENOUS
  Filled 2023-07-07: qty 250

## 2023-07-07 MED ORDER — VANCOMYCIN HCL 1250 MG/250ML IV SOLN: 1250.0000 mg | INTRAVENOUS | Status: DC

## 2023-07-07 MED ORDER — ATORVASTATIN CALCIUM 10 MG PO TABS
10.0000 mg | ORAL_TABLET | Freq: Every day | ORAL | Status: DC
  Administered 2023-07-07 – 2023-07-11 (×5): 10 mg via ORAL
  Filled 2023-07-07 (×5): qty 1

## 2023-07-07 MED ORDER — PANTOPRAZOLE SODIUM 40 MG PO TBEC
40.0000 mg | DELAYED_RELEASE_TABLET | Freq: Every day | ORAL | Status: DC
  Administered 2023-07-07 – 2023-07-12 (×6): 40 mg via ORAL
  Filled 2023-07-07 (×6): qty 1

## 2023-07-07 MED ORDER — ACETAMINOPHEN 325 MG PO TABS
650.0000 mg | ORAL_TABLET | Freq: Four times a day (QID) | ORAL | Status: DC | PRN
  Administered 2023-07-08 – 2023-07-12 (×3): 650 mg via ORAL
  Filled 2023-07-07 (×5): qty 2

## 2023-07-07 MED ORDER — DILTIAZEM HCL ER COATED BEADS 180 MG PO CP24
180.0000 mg | ORAL_CAPSULE | Freq: Every evening | ORAL | Status: DC
  Administered 2023-07-07 – 2023-07-11 (×5): 180 mg via ORAL
  Filled 2023-07-07 (×5): qty 1

## 2023-07-07 MED ORDER — PREDNISONE 5 MG PO TABS: 15.0000 mg | ORAL_TABLET | Freq: Every day | ORAL | Status: DC

## 2023-07-07 MED ORDER — INSULIN ASPART 100 UNIT/ML IJ SOLN
0.0000 [IU] | Freq: Three times a day (TID) | INTRAMUSCULAR | Status: DC
  Administered 2023-07-08: 1 [IU] via SUBCUTANEOUS
  Administered 2023-07-08 – 2023-07-09 (×3): 2 [IU] via SUBCUTANEOUS
  Administered 2023-07-09: 3 [IU] via SUBCUTANEOUS
  Administered 2023-07-10: 5 [IU] via SUBCUTANEOUS
  Administered 2023-07-10: 1 [IU] via SUBCUTANEOUS
  Administered 2023-07-10: 3 [IU] via SUBCUTANEOUS
  Administered 2023-07-11: 5 [IU] via SUBCUTANEOUS
  Administered 2023-07-11: 3 [IU] via SUBCUTANEOUS
  Administered 2023-07-11: 2 [IU] via SUBCUTANEOUS
  Administered 2023-07-12: 3 [IU] via SUBCUTANEOUS
  Administered 2023-07-12: 2 [IU] via SUBCUTANEOUS

## 2023-07-07 MED ORDER — INSULIN ASPART 100 UNIT/ML IJ SOLN
0.0000 [IU] | Freq: Every day | INTRAMUSCULAR | Status: DC
  Administered 2023-07-08: 2 [IU] via SUBCUTANEOUS
  Administered 2023-07-10: 5 [IU] via SUBCUTANEOUS
  Administered 2023-07-11: 2 [IU] via SUBCUTANEOUS

## 2023-07-07 MED ORDER — PREDNISONE 5 MG PO TABS: 25.0000 mg | ORAL_TABLET | Freq: Every day | ORAL | Status: DC

## 2023-07-07 MED ORDER — PREDNISONE 20 MG PO TABS
30.0000 mg | ORAL_TABLET | Freq: Every day | ORAL | Status: DC
  Filled 2023-07-07 (×2): qty 2

## 2023-07-07 MED ORDER — ONDANSETRON HCL 4 MG/2ML IJ SOLN: 4.0000 mg | Freq: Four times a day (QID) | INTRAMUSCULAR | Status: DC | PRN

## 2023-07-07 MED ORDER — POTASSIUM CHLORIDE CRYS ER 20 MEQ PO TBCR
40.0000 meq | EXTENDED_RELEASE_TABLET | ORAL | Status: AC
  Administered 2023-07-07 – 2023-07-08 (×2): 40 meq via ORAL
  Filled 2023-07-07 (×2): qty 2

## 2023-07-07 MED ORDER — MESALAMINE ER 0.375 G PO CP24: 1.5000 g | ORAL_CAPSULE | Freq: Every day | ORAL | Status: DC

## 2023-07-07 MED ORDER — PREDNISONE 5 MG PO TABS: 5.0000 mg | ORAL_TABLET | Freq: Every day | ORAL | Status: DC

## 2023-07-07 MED ORDER — LISINOPRIL 10 MG PO TABS
20.0000 mg | ORAL_TABLET | Freq: Every day | ORAL | Status: DC
  Administered 2023-07-07 – 2023-07-08 (×2): 20 mg via ORAL
  Filled 2023-07-07 (×2): qty 2

## 2023-07-07 MED ORDER — ACETAMINOPHEN 650 MG RE SUPP: 650.0000 mg | Freq: Four times a day (QID) | RECTAL | Status: DC | PRN

## 2023-07-07 MED ORDER — OXYCODONE HCL 5 MG PO TABS
5.0000 mg | ORAL_TABLET | Freq: Once | ORAL | Status: AC
  Administered 2023-07-07: 5 mg via ORAL
  Filled 2023-07-07: qty 1

## 2023-07-07 MED ORDER — MIRTAZAPINE 15 MG PO TABS
30.0000 mg | ORAL_TABLET | Freq: Every day | ORAL | Status: DC
  Administered 2023-07-07 – 2023-07-11 (×5): 30 mg via ORAL
  Filled 2023-07-07 (×6): qty 2
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
## 2023-07-07 NOTE — Telephone Encounter (Signed)
Pt's daughter called back and stated she went to check on him this morning. She says he didn't sleep well and that his right hand is so swollen "it looks like it's going to burst".  Advised her to take him the ER.

## 2023-07-07 NOTE — ED Provider Triage Note (Signed)
Emergency Medicine Provider Triage Evaluation Note  Marvin Lawson , a 82 y.o. male  was evaluated in triage.  Pt complains of pain and swelling of his right hand.  Notes swelling began on Saturday.  States his hand feels warm and tight.  Having difficulty closing his fist due to swelling.  Feels burning sensation of his hand that radiates to mid forearm.  Notes a blister to the dorsal surface of his hand that recently popped.  Denies any fever or chills.  Some scratchy throat as well.  Denies any swelling of his face lips or tongue.  Has recently been taking prednisone for ulcerative colitis.  Did not take prednisone last night or today believes his symptoms are related to allergic reaction to the prednisone.  Review of Systems  Positive: Swelling, redness right hand, scratchy throat Negative: Fever, chills, numbness of his hand or arm, swelling face lips or tongue  Physical Exam  BP 118/66   Pulse (!) 101   Temp 98.4 F (36.9 C)   Resp 20   Ht 6\' 1"  (1.854 m)   Wt 111.1 kg   SpO2 99%   BMI 32.32 kg/m  Gen:   Awake, no distress   Resp:  Normal effort  MSK:   Redness, edema right hand extending to mid right forearm.  Small scabbed area dorsal hand Other:    Medical Decision Making  Medically screening exam initiated at 11:36 AM.  Appropriate orders placed.  Marvin Lawson was informed that the remainder of the evaluation will be completed by another provider, this initial triage assessment does not replace that evaluation, and the importance of remaining in the ED until their evaluation is complete.     Pauline Aus, PA-C 07/07/23 1139
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
## 2023-07-07 NOTE — ED Triage Notes (Signed)
Pt c/o right hand swelling and redness since yesterday. Pt also reports he feels like his left jaw is starting to swell. Reports sore throat last night, none currently. Family member reports she is concerned he is having an allergic reaction to Prednisone that he started 5 days ago.

## 2023-07-07 NOTE — Assessment & Plan Note (Addendum)
In atria fib. Tachycardic, heart rate 96 to 101. Not on anticoagulation due to history of GI bleed.  -Resume diltiazem

## 2023-07-07 NOTE — Assessment & Plan Note (Signed)
Stable

## 2023-07-07 NOTE — Telephone Encounter (Signed)
Pt's daughter Carmie Kanner informed to keep [place] updated on pt's progress.
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
## 2023-07-07 NOTE — Assessment & Plan Note (Addendum)
Chronic diarrhea.  On Humira, and currently on prednisone with taper, started 10/8.  Reports chronic rectal bleed from ulcerative colitis. -Resume prednisone -As needed Imodium -SCDS for now, may consider pharmacologic DVT prophylaxis -Blood sugars were than 284, likely from steroids,- SSI- S

## 2023-07-07 NOTE — Progress Notes (Signed)
Pharmacy Antibiotic Note  Marvin Lawson is a 82 y.o. male admitted on 07/07/2023 with cellulitis.  Pharmacy has been consulted for vancomycin dosing x 7 days.  Plan: Day 1 of antibiotics MD already ordered vancomycin 1 g IV x 1 Give an additional dose of vancomycin 1000 mg IV x1 to complete load followed by 1250 mg IV Q24H. Goal AUC 400-550. Expected AUC: 457.5 Expected Css min: 13.0 SCr used: 1.44  Weight used: IBW, Vd used: 0.72 (BMI 32.32) Patient is also on Ceftriaxone 2 g IV Q24H Continue to monitor renal function and follow culture results   Height: 6\' 1"  (185.4 cm) Weight: 111.1 kg (245 lb) IBW/kg (Calculated) : 79.9  Temp (24hrs), Avg:98.3 F (36.8 C), Min:98.2 F (36.8 C), Max:98.4 F (36.9 C)  Recent Labs  Lab 07/07/23 1151 07/07/23 1301  WBC  --  18.0*  CREATININE 1.44*  --     Estimated Creatinine Clearance: 51.7 mL/min (A) (by C-G formula based on SCr of 1.44 mg/dL (H)).    Allergies  Allergen Reactions   Prednisone Other (See Comments)    Makes real nervous   Tramadol Palpitations    Antimicrobials this admission: 10/14 Ceftriaxone >>  10/14 Vanc >>   Dose adjustments this admission: N/A  Microbiology results: None  Thank you for allowing pharmacy to be a part of this patient's care.  Merryl Hacker, PharmD Clinical Pharmacist 07/07/2023 6:37 PM

## 2023-07-07 NOTE — Telephone Encounter (Signed)
Agree with this, thanks.  Please ask her to keep [place] updated about his progress.  Mitzie/Amanda, can we get an appointment with any provider in 4 weeks?  Thanks

## 2023-07-07 NOTE — Assessment & Plan Note (Signed)
Stable. -Resume lisinopril HCTZ, diltiazem

## 2023-07-07 NOTE — Telephone Encounter (Signed)
Pt's daughter Carmie Kanner called and stated was started on Prednisone last week. She says she seen him yesterday and his feet and hands are swollen really bad. She told him last night to stop taking the prednisone. Please advise. Thank you

## 2023-07-07 NOTE — Assessment & Plan Note (Addendum)
Creatinine 1.44.  At baseline.  CKD 3a.

## 2023-07-08 ENCOUNTER — Telehealth: Payer: Self-pay

## 2023-07-08 ENCOUNTER — Ambulatory Visit: Payer: 59 | Admitting: Infectious Diseases

## 2023-07-08 DIAGNOSIS — J449 Chronic obstructive pulmonary disease, unspecified: Secondary | ICD-10-CM

## 2023-07-08 DIAGNOSIS — I480 Paroxysmal atrial fibrillation: Secondary | ICD-10-CM | POA: Diagnosis not present

## 2023-07-08 DIAGNOSIS — A419 Sepsis, unspecified organism: Secondary | ICD-10-CM | POA: Diagnosis not present

## 2023-07-08 DIAGNOSIS — K51311 Ulcerative (chronic) rectosigmoiditis with rectal bleeding: Secondary | ICD-10-CM | POA: Diagnosis not present

## 2023-07-08 DIAGNOSIS — N1831 Chronic kidney disease, stage 3a: Secondary | ICD-10-CM

## 2023-07-08 DIAGNOSIS — L03113 Cellulitis of right upper limb: Secondary | ICD-10-CM | POA: Diagnosis not present

## 2023-07-08 LAB — BASIC METABOLIC PANEL
Anion gap: 12 (ref 5–15)
BUN: 20 mg/dL (ref 8–23)
CO2: 22 mmol/L (ref 22–32)
Calcium: 7.9 mg/dL — ABNORMAL LOW (ref 8.9–10.3)
Chloride: 100 mmol/L (ref 98–111)
Creatinine, Ser: 1.38 mg/dL — ABNORMAL HIGH (ref 0.61–1.24)
GFR, Estimated: 51 mL/min — ABNORMAL LOW (ref >60)
Glucose, Bld: 135 mg/dL — ABNORMAL HIGH (ref 70–99)
Potassium: 3.7 mmol/L (ref 3.5–5.1)
Sodium: 134 mmol/L — ABNORMAL LOW (ref 135–145)

## 2023-07-08 LAB — CBC
HCT: 34.3 % — ABNORMAL LOW (ref 39.0–52.0)
Hemoglobin: 11 g/dL — ABNORMAL LOW (ref 13.0–17.0)
MCH: 31.4 pg (ref 26.0–34.0)
MCHC: 32.1 g/dL (ref 30.0–36.0)
MCV: 98 fL (ref 80.0–100.0)
Platelets: 317 10*3/uL (ref 150–400)
RBC: 3.5 MIL/uL — ABNORMAL LOW (ref 4.22–5.81)
RDW: 12.1 % (ref 11.5–15.5)
WBC: 15.9 10*3/uL — ABNORMAL HIGH (ref 4.0–10.5)
nRBC: 0 % (ref 0.0–0.2)

## 2023-07-08 LAB — GLUCOSE, CAPILLARY
Glucose-Capillary: 199 mg/dL — ABNORMAL HIGH (ref 70–99)
Glucose-Capillary: 153 mg/dL — ABNORMAL HIGH (ref 70–99)
Glucose-Capillary: 136 mg/dL — ABNORMAL HIGH (ref 70–99)
Glucose-Capillary: 103 mg/dL — ABNORMAL HIGH (ref 70–99)
Glucose-Capillary: 227 mg/dL — ABNORMAL HIGH (ref 70–99)

## 2023-07-08 MED ORDER — ORAL CARE MOUTH RINSE: 15.0000 mL | OROMUCOSAL | Status: DC | PRN

## 2023-07-08 MED ORDER — OXYCODONE HCL 5 MG PO TABS
5.0000 mg | ORAL_TABLET | Freq: Three times a day (TID) | ORAL | Status: DC | PRN
  Administered 2023-07-08 – 2023-07-11 (×8): 5 mg via ORAL
  Filled 2023-07-08 (×8): qty 1

## 2023-07-08 MED ORDER — MESALAMINE ER 250 MG PO CPCR
1500.0000 mg | ORAL_CAPSULE | Freq: Every day | ORAL | Status: DC
  Administered 2023-07-08 – 2023-07-12 (×5): 1500 mg via ORAL
  Filled 2023-07-08 (×9): qty 6

## 2023-07-08 MED ORDER — POLYVINYL ALCOHOL 1.4 % OP SOLN: 1.0000 [drp] | OPHTHALMIC | Status: DC | PRN

## 2023-07-08 NOTE — Progress Notes (Signed)
PROGRESS NOTE  Marvin Lawson NGE:[id number] DOB: 1941/06/13   PCP: [name], MD  Patient is from: Home.  DOA: 07/07/2023 LOS: 1  Chief complaints Chief Complaint  Patient presents with   Hand Problem     Brief Narrative / Interim history: 82 year old M with PMH of COPD, A-fib not on AC, ulcerative colitis, GI bleed and prostate cancer presented to ED with swelling and redness in right hand and adjust part of his right arm with subjective fever and chills for about 2 days, and admitted with right hand cellulitis.  He was hemodynamically stable.  Right hand x-ray with generalized soft tissue edema without osseous abnormality.  He received IV Ancef in ED and started on vancomycin and ceftriaxone on admission.  Orthopedic surgery consulted and and did not feel MRI is indicated.  Subjective: Seen and examined earlier this morning.  No major events overnight of this morning.  Continues to endorse pain in his right hand.  He rates pain 6/10.  He also reports chronic hematochezia for months.  No other complaints.  Objective: Vitals:   07/07/23 2114 07/07/23 2353 07/08/23 0550 07/08/23 0904  BP:  127/61 (!) 145/78 118/61  Pulse:  96 100 96  Resp:  20 20 18   Temp:  98.9 F (37.2 C) 98.6 F (37 C) 98.7 F (37.1 C)  TempSrc:    Oral  SpO2: 95% 95% 100% 97%  Weight:      Height:        Examination:  GENERAL: No apparent distress.  Nontoxic. HEENT: MMM.  Vision grossly intact.  Diminished hearing. NECK: Supple.  No apparent JVD.  RESP:  No IWOB.  Fair aeration bilaterally. CVS:  RRR. Heart sounds normal.  ABD/GI/GU: BS+. Abd soft, NTND.  MSK/EXT: Swelling and redness in right hand.  No numbness or tingling.  Significant erythema.  No fluctuance. SKIN: As above. NEURO: Awake, alert and oriented appropriately.  No apparent focal neuro deficit. PSYCH: Calm. Normal affect.   Procedures:  None  Microbiology summarized: Blood cultures NGTD  Assessment and plan: Principal  Problem:   Cellulitis of right hand Active Problems:   Sepsis (HCC)   Essential hypertension, benign   Ulcerative colitis (HCC)   Paroxysmal atrial fibrillation (HCC)   COPD (chronic obstructive pulmonary disease) (HCC)   Chronic kidney disease (CKD), stage III (moderate) (HCC)  Sepsis due to right hand cellulitis: POA.  Heart tachycardia with leukocytosis.  X-ray with generalized soft tissue edema.  Still with significant pain, erythema and swelling.  Neurovascular intact.  No fluctuance.  Blood cultures NGTD. -Ortho consulted and did not feel MRI is indicated.  I have added Dr. Romeo Apple to treatment team and sent a secure chat. -Continue vancomycin and ceftriaxone -Continue arm elevation and ice compress -Multimodal pain control -N.p.o. until Ortho recommendation   Paroxysmal A-fib with mild RVR: RVR resolved.  Not on AC due to chronic GI bleed -Continue Cardizem   Ulcerative colitis (HCC) with chronic diarrhea and hematochezia. On Humira, and currently on prednisone with taper, started 10/8.  He is concerned about allergy to prednisone and refuses to take. -Encouraged him to take prednisone -Continue Imodium  Hyperglycemia: Likely due to steroid.  No history of diabetes. Recent Labs  Lab 07/08/23 0157 07/08/23 0923 07/08/23 1128  GLUCAP 199* 153* 136*  -Check hemoglobin A1c  CKD-3A: Stable Recent Labs    06/23/23 1136 07/07/23 1151 07/08/23 0348  BUN 19 23 20   CREATININE 1.44* 1.44* 1.38*  -Continue monitoring  Chronic COPD: Stable -  Nebs/inhalers as needed   Essential hypertension, benign -Continue home lisinopril/HCT and diltiazem.   Obesity Body mass index is 32.14 kg/m.          DVT prophylaxis:  SCDs Start: 07/07/23 2114  Code Status: Full code Family Communication: None at bedside Level of care: Telemetry Status is: Inpatient Remains inpatient appropriate because: Right hand cellulitis   Final disposition: Likely home once medically  cleared Consultants:  Orthopedic surgery  55 minutes with more than 50% spent in reviewing records, counseling patient/family and coordinating care.   Sch Meds:  Scheduled Meds:  atorvastatin  10 mg Oral QHS   diltiazem  180 mg Oral QPM   lisinopril  20 mg Oral QHS   And   hydrochlorothiazide  12.5 mg Oral QHS   insulin aspart  0-5 Units Subcutaneous QHS   insulin aspart  0-9 Units Subcutaneous TID WC   mesalamine  1,500 mg Oral Daily   mirtazapine  30 mg Oral QHS   pantoprazole  40 mg Oral Daily   predniSONE  35 mg Oral Q breakfast   Followed by   Melene Muller ON 07/11/2023] predniSONE  30 mg Oral Q breakfast   Followed by   Melene Muller ON 07/16/2023] predniSONE  25 mg Oral Q breakfast   Followed by   Melene Muller ON 07/21/2023] predniSONE  20 mg Oral Q breakfast   Followed by   Melene Muller ON 07/26/2023] predniSONE  15 mg Oral Q breakfast   Followed by   Melene Muller ON 07/31/2023] predniSONE  10 mg Oral Q breakfast   Followed by   Melene Muller ON 08/05/2023] predniSONE  5 mg Oral Q breakfast   Continuous Infusions:  cefTRIAXone (ROCEPHIN)  IV 2 g (07/08/23 0030)   vancomycin     PRN Meds:.acetaminophen **OR** acetaminophen, loperamide, ondansetron **OR** ondansetron (ZOFRAN) IV, oxyCODONE, polyvinyl alcohol  Antimicrobials: Anti-infectives (From admission, onward)    Start     Dose/Rate Route Frequency Ordered Stop   07/08/23 2000  vancomycin (VANCOREADY) IVPB 1250 mg/250 mL  Status:  Discontinued       Placed in "Followed by" Linked Group   1,250 mg 166.7 mL/hr over 90 Minutes Intravenous Every 24 hours 07/07/23 1845 07/07/23 1847   07/08/23 2000  vancomycin (VANCOREADY) IVPB 1250 mg/250 mL       Placed in "Followed by" Linked Group   1,250 mg 166.7 mL/hr over 90 Minutes Intravenous Every 24 hours 07/07/23 1847 07/14/23 1959   07/07/23 2000  cefTRIAXone (ROCEPHIN) 2 g in sodium chloride 0.9 % 100 mL IVPB        2 g 200 mL/hr over 30 Minutes Intravenous Every 24 hours 07/07/23 1834 07/14/23 1959    07/07/23 2000  vancomycin (VANCOREADY) IVPB 1500 mg/300 mL  Status:  Discontinued       Placed in "Followed by" Linked Group   1,500 mg 150 mL/hr over 120 Minutes Intravenous  Once 07/07/23 1845 07/07/23 1847   07/07/23 2000  vancomycin (VANCOCIN) IVPB 1000 mg/200 mL premix       Placed in "Followed by" Linked Group   1,000 mg 200 mL/hr over 60 Minutes Intravenous  Once 07/07/23 1847 07/07/23 2334   07/07/23 1900  vancomycin (VANCOCIN) IVPB 1000 mg/200 mL premix        1,000 mg 200 mL/hr over 60 Minutes Intravenous  Once 07/07/23 1834 07/07/23 2152   07/07/23 1645  ceFAZolin (ANCEF) IVPB 2g/100 mL premix        2 g 200 mL/hr over 30 Minutes Intravenous  Once 07/07/23 1644 07/07/23 1825        I have personally reviewed the following labs and images: CBC: Recent Labs  Lab 07/07/23 1301 07/08/23 0348  WBC 18.0* 15.9*  NEUTROABS 13.2*  --   HGB 11.6* 11.0*  HCT 35.9* 34.3*  MCV 98.4 98.0  PLT 342 317   BMP &GFR Recent Labs  Lab 07/07/23 1151 07/08/23 0348  NA 133* 134*  K 3.3* 3.7  CL 102 100  CO2 19* 22  GLUCOSE 284* 135*  BUN 23 20  CREATININE 1.44* 1.38*  CALCIUM 8.1* 7.9*   Estimated Creatinine Clearance: 53.8 mL/min (A) (by C-G formula based on SCr of 1.38 mg/dL (H)). Liver & Pancreas: No results for input(s): "AST", "ALT", "ALKPHOS", "BILITOT", "PROT", "ALBUMIN" in the last 168 hours. No results for input(s): "LIPASE", "AMYLASE" in the last 168 hours. No results for input(s): "AMMONIA" in the last 168 hours. Diabetic: No results for input(s): "HGBA1C" in the last 72 hours. Recent Labs  Lab 07/08/23 0157 07/08/23 0923 07/08/23 1128  GLUCAP 199* 153* 136*   Cardiac Enzymes: No results for input(s): "CKTOTAL", "CKMB", "CKMBINDEX", "TROPONINI" in the last 168 hours. No results for input(s): "PROBNP" in the last 8760 hours. Coagulation Profile: No results for input(s): "INR", "PROTIME" in the last 168 hours. Thyroid Function Tests: No results for  input(s): "TSH", "T4TOTAL", "FREET4", "T3FREE", "THYROIDAB" in the last 72 hours. Lipid Profile: No results for input(s): "CHOL", "HDL", "LDLCALC", "TRIG", "CHOLHDL", "LDLDIRECT" in the last 72 hours. Anemia Panel: No results for input(s): "VITAMINB12", "FOLATE", "FERRITIN", "TIBC", "IRON", "RETICCTPCT" in the last 72 hours. Urine analysis: No results found for: "COLORURINE", "APPEARANCEUR", "LABSPEC", "PHURINE", "GLUCOSEU", "HGBUR", "BILIRUBINUR", "KETONESUR", "PROTEINUR", "UROBILINOGEN", "NITRITE", "LEUKOCYTESUR" Sepsis Labs: Invalid input(s): "PROCALCITONIN", "LACTICIDVEN"  Microbiology: Recent Results (from the past 240 hour(s))  Culture, blood (Routine X 2) w Reflex to ID Panel     Status: None (Preliminary result)   Collection Time: 07/07/23  7:39 PM   Specimen: BLOOD  Result Value Ref Range Status   Specimen Description BLOOD RIGHT ANTECUBITAL  Final   Special Requests   Final    BOTTLES DRAWN AEROBIC AND ANAEROBIC Blood Culture results may not be optimal due to an excessive volume of blood received in culture bottles   Culture   Final    NO GROWTH < 12 HOURS Performed at [facility], [address] [place], [place] [address]    Report Status PENDING  Incomplete  Culture, blood (Routine X 2) w Reflex to ID Panel     Status: None (Preliminary result)   Collection Time: 07/07/23  7:39 PM   Specimen: BLOOD  Result Value Ref Range Status   Specimen Description BLOOD RIGHT ANTECUBITAL  Final   Special Requests   Final    BOTTLES DRAWN AEROBIC AND ANAEROBIC Blood Culture results may not be optimal due to an excessive volume of blood received in culture bottles   Culture   Final    NO GROWTH < 12 HOURS Performed at [facility], [address] [place], [place] [address]    Report Status PENDING  Incomplete    Radiology Studies: No results found.    Jahniya Duzan T. Oneill Bais [facility]  If 7PM-7AM, please contact night-coverage [email or web address] 07/08/2023, 2:12 PM

## 2023-07-08 NOTE — Consult Note (Signed)
Reason for Consult:*** Referring Physician: ***  REMIGIO MCMILLON is an 82 y.o. male.  HPI: ***  Past Medical History:  Diagnosis Date   Arthritis    CKD (chronic kidney disease), stage III (HCC)    COPD (chronic obstructive pulmonary disease) (HCC)    Depression    Essential hypertension    GERD (gastroesophageal reflux disease)    History of echocardiogram    a. 04/2019 Echo: EF 55-60%, mild conc LVH, mildly dil LA, mild Ao sclerosis. Triv TR.   Hyperlipidemia    PAF (paroxysmal atrial fibrillation) (HCC)    a. CHA2DS2VASc = 3-->no OAC 2/2 recurrent GIB.   Prostate cancer (HCC)    Rectal bleeding    UC (ulcerative colitis confined to rectum) (HCC)    Varicose veins of both lower extremities     Past Surgical History:  Procedure Laterality Date   BIOPSY  06/04/2019   Procedure: BIOPSY;  Surgeon: [name], MD;  [facility];  Service: Endoscopy;;  sigmoid   COLONOSCOPY N/A 04/23/2017   Procedure: COLONOSCOPY;  Surgeon: [name], MD;  [facility]: [facility];  Service: Endoscopy;  Laterality: N/A;  7:30   FLEXIBLE SIGMOIDOSCOPY N/A 06/04/2019   Procedure: FLEXIBLE SIGMOIDOSCOPY WITH PROPOFOL;  Surgeon: [name], MD;  Location: [facility];  Service: Endoscopy;  Laterality: N/A;  11:40   GASTRECTOMY     ulcers   PROSTATECTOMY      Family History  Problem Relation Age of Onset   Hypertension Mother     Social History:  reports that he has quit smoking. He has been exposed to tobacco smoke. He has never used smokeless tobacco. He reports that he does not currently use alcohol. He reports that he does not use drugs.  Allergies:  Allergies  Allergen Reactions   Prednisone Other (See Comments)    Makes real nervous   Tramadol Palpitations    Medications: {medication reviewed/display:3041432}  Results for orders placed or performed during the hospital encounter of 07/07/23 (from the past 48 hour(s))  Basic metabolic panel     Status:  Abnormal   Collection Time: 07/07/23 11:51 AM  Result Value Ref Range   Sodium 133 (L) 135 - 145 mmol/L   Potassium 3.3 (L) 3.5 - 5.1 mmol/L   Chloride 102 98 - 111 mmol/L   CO2 19 (L) 22 - 32 mmol/L   Glucose, Bld 284 (H) 70 - 99 mg/dL    Comment: Glucose reference range applies only to samples taken after fasting for at least 8 hours.   BUN 23 8 - 23 mg/dL   Creatinine, Ser 1.61 (H) 0.61 - 1.24 mg/dL   Calcium 8.1 (L) 8.9 - 10.3 mg/dL   GFR, Estimated 49 (L) >60 mL/min    Comment: (NOTE) Calculated using the CKD-EPI Creatinine Equation (2021)    Anion gap 12 5 - 15    Comment: Performed at [facility], [address] [place], [place] [address]  CBC with Differential/Platelet     Status: Abnormal   Collection Time: 07/07/23  1:01 PM  Result Value Ref Range   WBC 18.0 (H) 4.0 - 10.5 K/uL   RBC 3.65 (L) 4.22 - 5.81 MIL/uL   Hemoglobin 11.6 (L) 13.0 - 17.0 g/dL   HCT 54.0 (L) 98.1 - 19.1 %   MCV 98.4 80.0 - 100.0 fL   MCH 31.8 26.0 - 34.0 pg   MCHC 32.3 30.0 - 36.0 g/dL   RDW 47.8 29.5 -  15.5 %   Platelets 342 150 - 400 K/uL   nRBC 0.0 0.0 - 0.2 %   Neutrophils Relative % 72 %   Neutro Abs 13.2 (H) 1.7 - 7.7 K/uL   Lymphocytes Relative 15 %   Lymphs Abs 2.6 0.7 - 4.0 K/uL   Monocytes Relative 10 %   Monocytes Absolute 1.9 (H) 0.1 - 1.0 K/uL   Eosinophils Relative 1 %   Eosinophils Absolute 0.1 0.0 - 0.5 K/uL   Basophils Relative 1 %   Basophils Absolute 0.1 0.0 - 0.1 K/uL   Immature Granulocytes 1 %   Abs Immature Granulocytes 0.15 (H) 0.00 - 0.07 K/uL    Comment: Performed at [facility], [address] [place], [place] [address]  Culture, blood (Routine X 2) w Reflex to ID Panel     Status: None (Preliminary result)   Collection Time: 07/07/23  7:39 PM   Specimen: BLOOD  Result Value Ref Range   Specimen Description BLOOD RIGHT ANTECUBITAL    Special Requests      BOTTLES DRAWN AEROBIC AND ANAEROBIC Blood Culture results may not be optimal due to an excessive  volume of blood received in culture bottles   Culture      NO GROWTH < 12 HOURS Performed at [facility], [address] [place], [place] [address]    Report Status PENDING   Culture, blood (Routine X 2) w Reflex to ID Panel     Status: None (Preliminary result)   Collection Time: 07/07/23  7:39 PM   Specimen: BLOOD  Result Value Ref Range   Specimen Description BLOOD RIGHT ANTECUBITAL    Special Requests      BOTTLES DRAWN AEROBIC AND ANAEROBIC Blood Culture results may not be optimal due to an excessive volume of blood received in culture bottles   Culture      NO GROWTH < 12 HOURS Performed at [facility], [address], [place], [place] [address]    Report Status PENDING   Lactic acid, plasma     Status: None   Collection Time: 07/07/23  7:39 PM  Result Value Ref Range   Lactic Acid, Venous 1.5 0.5 - 1.9 mmol/L    Comment: Performed at [facility], [address] [place], [place] [address]  Glucose, capillary     Status: Abnormal   Collection Time: 07/08/23  1:57 AM  Result Value Ref Range   Glucose-Capillary 199 (H) 70 - 99 mg/dL    Comment: Glucose reference range applies only to samples taken after fasting for at least 8 hours.  Basic metabolic panel     Status: Abnormal   Collection Time: 07/08/23  3:48 AM  Result Value Ref Range   Sodium 134 (L) 135 - 145 mmol/L   Potassium 3.7 3.5 - 5.1 mmol/L   Chloride 100 98 - 111 mmol/L   CO2 22 22 - 32 mmol/L   Glucose, Bld 135 (H) 70 - 99 mg/dL    Comment: Glucose reference range applies only to samples taken after fasting for at least 8 hours.   BUN 20 8 - 23 mg/dL   Creatinine, Ser 2.95 (H) 0.61 - 1.24 mg/dL   Calcium 7.9 (L) 8.9 - 10.3 mg/dL   GFR, Estimated 51 (L) >60 mL/min    Comment: (NOTE) Calculated using the CKD-EPI Creatinine Equation (2021)    Anion gap 12 5 - 15    Comment: Performed at [facility], [address] [place], [place] [address]  CBC  Status: Abnormal   Collection Time: 07/08/23  3:48 AM   Result Value Ref Range   WBC 15.9 (H) 4.0 - 10.5 K/uL   RBC 3.50 (L) 4.22 - 5.81 MIL/uL   Hemoglobin 11.0 (L) 13.0 - 17.0 g/dL   HCT 16.1 (L) 09.6 - 04.5 %   MCV 98.0 80.0 - 100.0 fL   MCH 31.4 26.0 - 34.0 pg   MCHC 32.1 30.0 - 36.0 g/dL   RDW 40.9 81.1 - 91.4 %   Platelets 317 150 - 400 K/uL   nRBC 0.0 0.0 - 0.2 %    Comment: Performed at [facility], [address] [place], [place] [address]  Glucose, capillary     Status: Abnormal   Collection Time: 07/08/23  9:23 AM  Result Value Ref Range   Glucose-Capillary 153 (H) 70 - 99 mg/dL    Comment: Glucose reference range applies only to samples taken after fasting for at least 8 hours.  Glucose, capillary     Status: Abnormal   Collection Time: 07/08/23 11:28 AM  Result Value Ref Range   Glucose-Capillary 136 (H) 70 - 99 mg/dL    Comment: Glucose reference range applies only to samples taken after fasting for at least 8 hours.    DG Hand Complete Right  Result Date: 07/07/2023 CLINICAL DATA:  Pain and swelling. EXAM: RIGHT HAND - COMPLETE 3+ VIEW COMPARISON:  None Available. FINDINGS: No fracture or dislocation. Multifocal osteoarthritis with joint space narrowing and spurring. No erosion or periostitis. No evidence of focal bone abnormality or bone destruction. There is generalized soft tissue edema. No soft tissue gas or radiopaque foreign body. IMPRESSION: 1. Generalized soft tissue edema. No acute osseous abnormality. 2. Mild multifocal osteoarthritis. Electronically Signed   By: Narda Rutherford M.D.   On: 07/07/2023 15:09    Review of Systems Blood pressure (!) 113/59, pulse 84, temperature 99.4 F (37.4 C), temperature source Oral, resp. rate 20, height 6\' 1"  (1.854 m), weight 110.5 kg, SpO2 98%. Physical Exam  Assessment/Plan: ***  Fuller Canada 07/08/2023, 3:23 PM

## 2023-07-08 NOTE — Progress Notes (Signed)
Patient ID: Marvin Lawson, male   DOB: Mar 19, 1941, 82 y.o.   MRN: [id number]   Cellulitis of the hand   I rec NOT GETTING a CT   If clinically indicated MRI is ok to look for abscess

## 2023-07-08 NOTE — Progress Notes (Signed)
   07/08/23 1047  TOC Brief Assessment  Insurance and Status Reviewed  Patient has primary care physician Yes  Home environment has been reviewed from home  Prior level of function: independent  Prior/Current Home Services No current home services  Social Determinants of Health Reivew SDOH reviewed no interventions necessary  Readmission risk has been reviewed Yes  Transition of care needs no transition of care needs at this time     Pt from home. MD anticipating dc in 2 days. TOC will follow and assist if needs arise.

## 2023-07-08 NOTE — Consult Note (Signed)
Cellulitis right hand   Rec IV ATBX   Page if not getting better

## 2023-07-08 NOTE — Telephone Encounter (Signed)
Called patient regarding today's appointment. Since he is currently admitted at [facility] will need to cancel and reschedule. Spoke with his daughter who will call once patient had discharged.  Juanita Laster, RMA

## 2023-07-09 ENCOUNTER — Encounter (HOSPITAL_COMMUNITY): Payer: Self-pay | Admitting: Gastroenterology

## 2023-07-09 DIAGNOSIS — I480 Paroxysmal atrial fibrillation: Secondary | ICD-10-CM | POA: Diagnosis not present

## 2023-07-09 DIAGNOSIS — L03113 Cellulitis of right upper limb: Secondary | ICD-10-CM | POA: Diagnosis not present

## 2023-07-09 DIAGNOSIS — K51311 Ulcerative (chronic) rectosigmoiditis with rectal bleeding: Secondary | ICD-10-CM | POA: Diagnosis not present

## 2023-07-09 DIAGNOSIS — A419 Sepsis, unspecified organism: Secondary | ICD-10-CM | POA: Diagnosis not present

## 2023-07-09 LAB — RENAL FUNCTION PANEL
Albumin: 2.4 g/dL — ABNORMAL LOW (ref 3.5–5.0)
Anion gap: 9 (ref 5–15)
BUN: 29 mg/dL — ABNORMAL HIGH (ref 8–23)
CO2: 22 mmol/L (ref 22–32)
Calcium: 7.5 mg/dL — ABNORMAL LOW (ref 8.9–10.3)
Chloride: 101 mmol/L (ref 98–111)
Creatinine, Ser: 1.91 mg/dL — ABNORMAL HIGH (ref 0.61–1.24)
GFR, Estimated: 35 mL/min — ABNORMAL LOW (ref >60)
Glucose, Bld: 145 mg/dL — ABNORMAL HIGH (ref 70–99)
Phosphorus: 3.4 mg/dL (ref 2.5–4.6)
Potassium: 3.6 mmol/L (ref 3.5–5.1)
Sodium: 132 mmol/L — ABNORMAL LOW (ref 135–145)

## 2023-07-09 LAB — GLUCOSE, CAPILLARY
Glucose-Capillary: 165 mg/dL — ABNORMAL HIGH (ref 70–99)
Glucose-Capillary: 223 mg/dL — ABNORMAL HIGH (ref 70–99)
Glucose-Capillary: 173 mg/dL — ABNORMAL HIGH (ref 70–99)
Glucose-Capillary: 167 mg/dL — ABNORMAL HIGH (ref 70–99)

## 2023-07-09 LAB — CBC
HCT: 33.9 % — ABNORMAL LOW (ref 39.0–52.0)
Hemoglobin: 11 g/dL — ABNORMAL LOW (ref 13.0–17.0)
MCH: 32 pg (ref 26.0–34.0)
MCHC: 32.4 g/dL (ref 30.0–36.0)
MCV: 98.5 fL (ref 80.0–100.0)
Platelets: 304 10*3/uL (ref 150–400)
RBC: 3.44 MIL/uL — ABNORMAL LOW (ref 4.22–5.81)
RDW: 12.1 % (ref 11.5–15.5)
WBC: 13.9 10*3/uL — ABNORMAL HIGH (ref 4.0–10.5)
nRBC: 0 % (ref 0.0–0.2)

## 2023-07-09 LAB — HEMOGLOBIN AND HEMATOCRIT, BLOOD
HCT: 32.4 % — ABNORMAL LOW (ref 39.0–52.0)
Hemoglobin: 10.7 g/dL — ABNORMAL LOW (ref 13.0–17.0)

## 2023-07-09 LAB — VANCOMYCIN, TROUGH: Vancomycin Tr: 14 ug/mL — ABNORMAL LOW (ref 15–20)

## 2023-07-09 LAB — MAGNESIUM: Magnesium: 1.7 mg/dL (ref 1.7–2.4)

## 2023-07-09 MED ORDER — LACTATED RINGERS IV BOLUS
500.0000 mL | Freq: Once | INTRAVENOUS | Status: AC
  Administered 2023-07-09: 500 mL via INTRAVENOUS

## 2023-07-09 MED ORDER — VANCOMYCIN HCL 1750 MG/350ML IV SOLN
1750.0000 mg | INTRAVENOUS | Status: DC
  Administered 2023-07-10: 1750 mg via INTRAVENOUS
  Filled 2023-07-09 (×3): qty 350
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
## 2023-07-09 NOTE — Progress Notes (Signed)
Mobility Specialist Progress Note:    07/09/23 1330  Mobility  Activity Ambulated with assistance in hallway  Level of Assistance Contact guard assist, steadying assist  Assistive Device Front wheel walker  Distance Ambulated (ft) 100 ft  Range of Motion/Exercises Active;All extremities  Activity Response Tolerated well  Mobility Referral Yes  $Mobility charge 1 Mobility  Mobility Specialist Start Time (ACUTE ONLY) 1330  Mobility Specialist Stop Time (ACUTE ONLY) 1345  Mobility Specialist Time Calculation (min) (ACUTE ONLY) 15 min   Pt received in bed, agreeable to mobility. Required CGA to stand and ambulate with RW. Tolerated well, pt's right hand started bleeding an draining during ambulation. Pt denies any pain. Returned pt to room, Occupational hygienist. Left pt sitting EOB with RN, all needs met.   Lawerance Bach Mobility Specialist Please contact via Special educational needs teacher or  Rehab office at [phone]

## 2023-07-10 ENCOUNTER — Inpatient Hospital Stay (HOSPITAL_COMMUNITY): Payer: 59

## 2023-07-10 DIAGNOSIS — A419 Sepsis, unspecified organism: Secondary | ICD-10-CM | POA: Diagnosis not present

## 2023-07-10 DIAGNOSIS — K51311 Ulcerative (chronic) rectosigmoiditis with rectal bleeding: Secondary | ICD-10-CM | POA: Diagnosis not present

## 2023-07-10 DIAGNOSIS — I480 Paroxysmal atrial fibrillation: Secondary | ICD-10-CM | POA: Diagnosis not present

## 2023-07-10 DIAGNOSIS — L03113 Cellulitis of right upper limb: Secondary | ICD-10-CM | POA: Diagnosis not present

## 2023-07-10 LAB — BASIC METABOLIC PANEL
Anion gap: 10 (ref 5–15)
BUN: 29 mg/dL — ABNORMAL HIGH (ref 8–23)
CO2: 20 mmol/L — ABNORMAL LOW (ref 22–32)
Calcium: 7.8 mg/dL — ABNORMAL LOW (ref 8.9–10.3)
Chloride: 102 mmol/L (ref 98–111)
Creatinine, Ser: 1.58 mg/dL — ABNORMAL HIGH (ref 0.61–1.24)
GFR, Estimated: 43 mL/min — ABNORMAL LOW (ref >60)
Glucose, Bld: 141 mg/dL — ABNORMAL HIGH (ref 70–99)
Potassium: 3.6 mmol/L (ref 3.5–5.1)
Sodium: 132 mmol/L — ABNORMAL LOW (ref 135–145)

## 2023-07-10 LAB — CBC WITH DIFFERENTIAL/PLATELET
Abs Immature Granulocytes: 0.09 10*3/uL — ABNORMAL HIGH (ref 0.00–0.07)
Basophils Absolute: 0.1 10*3/uL (ref 0.0–0.1)
Basophils Relative: 1 %
Eosinophils Absolute: 0.4 10*3/uL (ref 0.0–0.5)
Eosinophils Relative: 3 %
HCT: 33.2 % — ABNORMAL LOW (ref 39.0–52.0)
Hemoglobin: 10.9 g/dL — ABNORMAL LOW (ref 13.0–17.0)
Immature Granulocytes: 1 %
Lymphocytes Relative: 15 %
Lymphs Abs: 1.9 10*3/uL (ref 0.7–4.0)
MCH: 32 pg (ref 26.0–34.0)
MCHC: 32.8 g/dL (ref 30.0–36.0)
MCV: 97.4 fL (ref 80.0–100.0)
Monocytes Absolute: 1.5 10*3/uL — ABNORMAL HIGH (ref 0.1–1.0)
Monocytes Relative: 11 %
Neutro Abs: 9.2 10*3/uL — ABNORMAL HIGH (ref 1.7–7.7)
Neutrophils Relative %: 69 %
Platelets: 292 10*3/uL (ref 150–400)
RBC: 3.41 MIL/uL — ABNORMAL LOW (ref 4.22–5.81)
RDW: 12.2 % (ref 11.5–15.5)
WBC: 13.2 10*3/uL — ABNORMAL HIGH (ref 4.0–10.5)
nRBC: 0 % (ref 0.0–0.2)

## 2023-07-10 LAB — GLUCOSE, CAPILLARY
Glucose-Capillary: 124 mg/dL — ABNORMAL HIGH (ref 70–99)
Glucose-Capillary: 206 mg/dL — ABNORMAL HIGH (ref 70–99)
Glucose-Capillary: 273 mg/dL — ABNORMAL HIGH (ref 70–99)
Glucose-Capillary: 423 mg/dL — ABNORMAL HIGH (ref 70–99)

## 2023-07-10 LAB — LACTIC ACID, PLASMA: Lactic Acid, Venous: 0.7 mmol/L (ref 0.5–1.9)

## 2023-07-10 MED ORDER — GADOBUTROL 1 MMOL/ML IV SOLN
10.0000 mL | Freq: Once | INTRAVENOUS | Status: AC | PRN
  Administered 2023-07-10: 10 mL via INTRAVENOUS
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
## 2023-07-10 NOTE — Plan of Care (Signed)
  Problem: Education: Goal: Knowledge of General Education information will improve Description: Including pain rating scale, medication(s)/side effects and non-pharmacologic comfort measures Outcome: Progressing   Problem: Health Behavior/Discharge Planning: Goal: Ability to manage health-related needs will improve Outcome: Progressing   Problem: Clinical Measurements: Goal: Ability to maintain clinical measurements within normal limits will improve Outcome: Progressing Goal: Will remain free from infection Outcome: Progressing Goal: Diagnostic test results will improve Outcome: Progressing Goal: Respiratory complications will improve Outcome: Progressing Goal: Cardiovascular complication will be avoided Outcome: Progressing   Problem: Activity: Goal: Risk for activity intolerance will decrease Outcome: Progressing   Problem: Nutrition: Goal: Adequate nutrition will be maintained Outcome: Progressing   Problem: Coping: Goal: Level of anxiety will decrease Outcome: Progressing   Problem: Elimination: Goal: Will not experience complications related to bowel motility Outcome: Progressing Goal: Will not experience complications related to urinary retention Outcome: Progressing   Problem: Pain Managment: Goal: General experience of comfort will improve Outcome: Progressing   Problem: Safety: Goal: Ability to remain free from injury will improve Outcome: Progressing   Problem: Skin Integrity: Goal: Risk for impaired skin integrity will decrease Outcome: Progressing   Problem: Education: Goal: Ability to describe self-care measures that may prevent or decrease complications (Diabetes Survival Skills Education) will improve Outcome: Progressing Goal: Individualized Educational Video(s) Outcome: Progressing   Problem: Coping: Goal: Ability to adjust to condition or change in health will improve Outcome: Progressing   Problem: Fluid Volume: Goal: Ability to  maintain a balanced intake and output will improve Outcome: Progressing   Problem: Health Behavior/Discharge Planning: Goal: Ability to identify and utilize available resources and services will improve Outcome: Progressing Goal: Ability to manage health-related needs will improve Outcome: Progressing   Problem: Metabolic: Goal: Ability to maintain appropriate glucose levels will improve Outcome: Progressing   Problem: Nutritional: Goal: Maintenance of adequate nutrition will improve Outcome: Progressing Goal: Progress toward achieving an optimal weight will improve Outcome: Progressing   Problem: Skin Integrity: Goal: Risk for impaired skin integrity will decrease Outcome: Progressing   Problem: Tissue Perfusion: Goal: Adequacy of tissue perfusion will improve Outcome: Progressing   Problem: Education: Goal: Understanding of post-operative needs will improve Outcome: Progressing Goal: Individualized Educational Video(s) Outcome: Progressing   Problem: Clinical Measurements: Goal: Postoperative complications will be avoided or minimized Outcome: Progressing   Problem: Respiratory: Goal: Will regain and/or maintain adequate ventilation Outcome: Progressing

## 2023-07-10 NOTE — Progress Notes (Signed)
Pharmacy Antibiotic Note  Marvin Lawson is a 82 y.o. male admitted on 07/07/2023 with cellulitis.  Pharmacy has been consulted for vancomycin dosing x 7 days.  Plan: Continue vancomycin 1750 mg IV every 48 hours. Patient is also on Ceftriaxone 2 g IV Q24H Continue to monitor renal function and follow culture results   Height: 6\' 1"  (185.4 cm) Weight: 110.5 kg (243 lb 9.7 oz) IBW/kg (Calculated) : 79.9  Temp (24hrs), Avg:98.4 F (36.9 C), Min:97.9 F (36.6 C), Max:98.9 F (37.2 C)  Recent Labs  Lab 07/07/23 1151 07/07/23 1301 07/07/23 1939 07/08/23 0348 07/09/23 0359 07/09/23 0951 07/10/23 0359  WBC  --  18.0*  --  15.9* 13.9*  --  13.2*  CREATININE 1.44*  --   --  1.38* 1.91*  --  1.58*  LATICACIDVEN  --   --  1.5  --   --   --  0.7  VANCOTROUGH  --   --   --   --   --  14*  --     Estimated Creatinine Clearance: 47 mL/min (A) (by C-G formula based on SCr of 1.58 mg/dL (H)).    Allergies  Allergen Reactions   Prednisone Other (See Comments)    Makes real nervous   Tramadol Palpitations    Antimicrobials this admission: 10/14 Ceftriaxone >>  10/14 Vanc >>    Microbiology results: 10/14 Bcx: ngtd   Thank you for allowing pharmacy to be a part of this patient's care.  Tad Moore, PharmD Clinical Pharmacist 07/10/2023 1:13 PM

## 2023-07-10 NOTE — Progress Notes (Signed)
Right hand cleaned. Swelling and redness noted to site. Open spot also noted on top of hand, with copious amounts of yellow drainage oozing from it.

## 2023-07-10 NOTE — Progress Notes (Signed)
NT AK assisted pt in bathroom and noted that patient had bright red blood in brief and toilet with BM. Pt stated that this is normal thing for him since he has ulcerative colitis, he stated that it happens every time he goes to the bathroom. Notified Dr. Blake Divine. No new orders.     07/10/23 0904  Vitals  Temp 97.9 F (36.6 C)  Temp Source Oral  BP (!) 114/55  MAP (mmHg) 72  BP Location Left Arm  BP Method Automatic  Patient Position (if appropriate) Lying  Pulse Rate 86  Pulse Rate Source Dinamap  Resp 20  MEWS COLOR  MEWS Score Color Green  Oxygen Therapy  SpO2 97 %  O2 Device Room Air  Pain Assessment  Pain Scale 0-10  Pain Score 6  Pain Type Acute pain  Pain Location Hand  Pain Orientation Right  Pain Descriptors / Indicators Jabbing  Pain Frequency Constant  Pain Onset On-going  Patients Stated Pain Goal 0  Pain Intervention(s) Medication (See eMAR)  MEWS Score  MEWS Temp 0  MEWS Systolic 0  MEWS Pulse 0  MEWS RR 0  MEWS LOC 0  MEWS Score 0

## 2023-07-10 NOTE — Plan of Care (Signed)

## 2023-07-11 ENCOUNTER — Telehealth: Payer: Self-pay | Admitting: Orthopedic Surgery

## 2023-07-11 DIAGNOSIS — I480 Paroxysmal atrial fibrillation: Secondary | ICD-10-CM | POA: Diagnosis not present

## 2023-07-11 DIAGNOSIS — L03113 Cellulitis of right upper limb: Secondary | ICD-10-CM | POA: Diagnosis not present

## 2023-07-11 DIAGNOSIS — K51311 Ulcerative (chronic) rectosigmoiditis with rectal bleeding: Secondary | ICD-10-CM | POA: Diagnosis not present

## 2023-07-11 DIAGNOSIS — A419 Sepsis, unspecified organism: Secondary | ICD-10-CM | POA: Diagnosis not present

## 2023-07-11 LAB — GLUCOSE, CAPILLARY
Glucose-Capillary: 230 mg/dL — ABNORMAL HIGH (ref 70–99)
Glucose-Capillary: 274 mg/dL — ABNORMAL HIGH (ref 70–99)
Glucose-Capillary: 184 mg/dL — ABNORMAL HIGH (ref 70–99)
Glucose-Capillary: 227 mg/dL — ABNORMAL HIGH (ref 70–99)

## 2023-07-11 LAB — BASIC METABOLIC PANEL
Anion gap: 11 (ref 5–15)
BUN: 32 mg/dL — ABNORMAL HIGH (ref 8–23)
CO2: 20 mmol/L — ABNORMAL LOW (ref 22–32)
Calcium: 7.8 mg/dL — ABNORMAL LOW (ref 8.9–10.3)
Chloride: 99 mmol/L (ref 98–111)
Creatinine, Ser: 1.74 mg/dL — ABNORMAL HIGH (ref 0.61–1.24)
GFR, Estimated: 39 mL/min — ABNORMAL LOW (ref >60)
Glucose, Bld: 283 mg/dL — ABNORMAL HIGH (ref 70–99)
Potassium: 4.2 mmol/L (ref 3.5–5.1)
Sodium: 130 mmol/L — ABNORMAL LOW (ref 135–145)

## 2023-07-11 LAB — CBC WITH DIFFERENTIAL/PLATELET
Abs Immature Granulocytes: 0.16 10*3/uL — ABNORMAL HIGH (ref 0.00–0.07)
Basophils Absolute: 0.1 10*3/uL (ref 0.0–0.1)
Basophils Relative: 0 %
Eosinophils Absolute: 0.1 10*3/uL (ref 0.0–0.5)
Eosinophils Relative: 0 %
HCT: 35.1 % — ABNORMAL LOW (ref 39.0–52.0)
Hemoglobin: 11.3 g/dL — ABNORMAL LOW (ref 13.0–17.0)
Immature Granulocytes: 1 %
Lymphocytes Relative: 12 %
Lymphs Abs: 1.8 10*3/uL (ref 0.7–4.0)
MCH: 31.9 pg (ref 26.0–34.0)
MCHC: 32.2 g/dL (ref 30.0–36.0)
MCV: 99.2 fL (ref 80.0–100.0)
Monocytes Absolute: 1.7 10*3/uL — ABNORMAL HIGH (ref 0.1–1.0)
Monocytes Relative: 12 %
Neutro Abs: 11 10*3/uL — ABNORMAL HIGH (ref 1.7–7.7)
Neutrophils Relative %: 75 %
Platelets: 334 10*3/uL (ref 150–400)
RBC: 3.54 MIL/uL — ABNORMAL LOW (ref 4.22–5.81)
RDW: 11.9 % (ref 11.5–15.5)
WBC: 14.9 10*3/uL — ABNORMAL HIGH (ref 4.0–10.5)
nRBC: 0 % (ref 0.0–0.2)

## 2023-07-11 LAB — SODIUM, URINE, RANDOM: Sodium, Ur: 88 mmol/L

## 2023-07-11 MED ORDER — LORAZEPAM 2 MG/ML IJ SOLN: 0.5000 mg | Freq: Once | INTRAMUSCULAR | Status: DC

## 2023-07-11 MED ORDER — INSULIN ASPART 100 UNIT/ML IJ SOLN
4.0000 [IU] | Freq: Three times a day (TID) | INTRAMUSCULAR | Status: DC
  Administered 2023-07-11 – 2023-07-12 (×4): 4 [IU] via SUBCUTANEOUS
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
## 2023-07-11 NOTE — Telephone Encounter (Signed)
Dr. Blake Divine called for Dr. Romeo Apple, she stated Dr. Romeo Apple consulted the patient.  She ordered a MRI and the patient has two abscess on his hand and they need to be drained.  She would like a call back on her cell phone [phone].

## 2023-07-11 NOTE — Plan of Care (Signed)
[facility] Tonsillectomy/Adenoidectomy, Postoperative PEDS care plan entered in error.

## 2023-07-11 NOTE — Inpatient Diabetes Management (Signed)
Inpatient Diabetes Program Recommendations  AACE/ADA: New Consensus Statement on Inpatient Glycemic Control (2015)  Target Ranges:  Prepandial:   less than 140 mg/dL      Peak postprandial:   less than 180 mg/dL (1-2 hours)      Critically ill patients:  140 - 180 mg/dL   Lab Results  Component Value Date   GLUCAP 230 (H) 07/11/2023    Review of Glycemic Control  Latest Reference Range & Units 07/10/23 11:27 07/10/23 16:26 07/10/23 20:10 07/11/23 07:37  Glucose-Capillary 70 - 99 mg/dL 409 (H) 811 (H) 914 (H) 230 (H)  (H): Data is abnormally high Diabetes history: Type 2 DM Outpatient Diabetes medications: none Current orders for Inpatient glycemic control: Novolog 0-9 units TID & HS Prednisone 35 mg every day with plan for taper  Inpatient Diabetes Program Recommendations:    In the setting of steroids: Consider adding Novolog 4 units TID (assuming patient is consuming >50% of meals)  Thanks, Lujean Rave, MSN, RNC-OB Diabetes Coordinator [phone] (8a-5p)

## 2023-07-11 NOTE — Telephone Encounter (Signed)
Handled.   

## 2023-07-11 NOTE — Plan of Care (Signed)
  Problem: Education: Goal: Knowledge of General Education information will improve Description: Including pain rating scale, medication(s)/side effects and non-pharmacologic comfort measures Outcome: Progressing   Problem: Health Behavior/Discharge Planning: Goal: Ability to manage health-related needs will improve Outcome: Progressing   Problem: Clinical Measurements: Goal: Ability to maintain clinical measurements within normal limits will improve Outcome: Progressing Goal: Will remain free from infection Outcome: Not Met (add Reason) Goal: Diagnostic test results will improve Outcome: Not Met (add Reason)

## 2023-07-12 DIAGNOSIS — I480 Paroxysmal atrial fibrillation: Secondary | ICD-10-CM | POA: Diagnosis not present

## 2023-07-12 DIAGNOSIS — L03113 Cellulitis of right upper limb: Secondary | ICD-10-CM | POA: Diagnosis not present

## 2023-07-12 DIAGNOSIS — N1831 Chronic kidney disease, stage 3a: Secondary | ICD-10-CM | POA: Diagnosis not present

## 2023-07-12 DIAGNOSIS — A419 Sepsis, unspecified organism: Secondary | ICD-10-CM | POA: Diagnosis not present

## 2023-07-12 LAB — BASIC METABOLIC PANEL
Anion gap: 9 (ref 5–15)
BUN: 24 mg/dL — ABNORMAL HIGH (ref 8–23)
CO2: 23 mmol/L (ref 22–32)
Calcium: 8 mg/dL — ABNORMAL LOW (ref 8.9–10.3)
Chloride: 103 mmol/L (ref 98–111)
Creatinine, Ser: 1.41 mg/dL — ABNORMAL HIGH (ref 0.61–1.24)
GFR, Estimated: 50 mL/min — ABNORMAL LOW (ref >60)
Glucose, Bld: 159 mg/dL — ABNORMAL HIGH (ref 70–99)
Potassium: 4.1 mmol/L (ref 3.5–5.1)
Sodium: 135 mmol/L (ref 135–145)

## 2023-07-12 LAB — CBC WITH DIFFERENTIAL/PLATELET
Abs Immature Granulocytes: 0.09 10*3/uL — ABNORMAL HIGH (ref 0.00–0.07)
Basophils Absolute: 0.1 10*3/uL (ref 0.0–0.1)
Basophils Relative: 1 %
Eosinophils Absolute: 0.4 10*3/uL (ref 0.0–0.5)
Eosinophils Relative: 4 %
HCT: 34.3 % — ABNORMAL LOW (ref 39.0–52.0)
Hemoglobin: 10.9 g/dL — ABNORMAL LOW (ref 13.0–17.0)
Immature Granulocytes: 1 %
Lymphocytes Relative: 19 %
Lymphs Abs: 2 10*3/uL (ref 0.7–4.0)
MCH: 32.2 pg (ref 26.0–34.0)
MCHC: 31.8 g/dL (ref 30.0–36.0)
MCV: 101.5 fL — ABNORMAL HIGH (ref 80.0–100.0)
Monocytes Absolute: 1.2 10*3/uL — ABNORMAL HIGH (ref 0.1–1.0)
Monocytes Relative: 12 %
Neutro Abs: 6.5 10*3/uL (ref 1.7–7.7)
Neutrophils Relative %: 63 %
Platelets: 304 10*3/uL (ref 150–400)
RBC: 3.38 MIL/uL — ABNORMAL LOW (ref 4.22–5.81)
RDW: 12.2 % (ref 11.5–15.5)
WBC: 10.2 10*3/uL (ref 4.0–10.5)
nRBC: 0 % (ref 0.0–0.2)

## 2023-07-12 LAB — CULTURE, BLOOD (ROUTINE X 2)
Culture: NO GROWTH
Culture: NO GROWTH

## 2023-07-12 LAB — GLUCOSE, CAPILLARY
Glucose-Capillary: 128 mg/dL — ABNORMAL HIGH (ref 70–99)
Glucose-Capillary: 164 mg/dL — ABNORMAL HIGH (ref 70–99)

## 2023-07-12 LAB — OSMOLALITY: Osmolality: 298 mosm/kg — ABNORMAL HIGH (ref 275–295)

## 2023-07-12 MED ORDER — DOXYCYCLINE HYCLATE 100 MG PO TABS: 100.0000 mg | ORAL_TABLET | Freq: Two times a day (BID) | ORAL | 0 refills | Status: AC

## 2023-07-12 MED ORDER — AMOXICILLIN-POT CLAVULANATE 500-125 MG PO TABS: 1.0000 | ORAL_TABLET | Freq: Two times a day (BID) | ORAL | 0 refills | Status: AC

## 2023-07-12 MED ORDER — SILVER SULFADIAZINE 1 % EX CREA
TOPICAL_CREAM | Freq: Two times a day (BID) | CUTANEOUS | Status: DC
  Filled 2023-07-12: qty 85

## 2023-07-12 MED ORDER — SILVER SULFADIAZINE 1 % EX CREA: TOPICAL_CREAM | Freq: Two times a day (BID) | CUTANEOUS | 0 refills | Status: AC
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
## 2023-07-12 NOTE — Plan of Care (Signed)

## 2023-07-13 LAB — HEMOGLOBIN A1C
Hgb A1c MFr Bld: 7.7 % — ABNORMAL HIGH (ref 4.8–5.6)
Mean Plasma Glucose: 174.29 mg/dL

## 2023-07-14 ENCOUNTER — Ambulatory Visit: Payer: 59 | Attending: Nurse Practitioner

## 2023-07-15 DIAGNOSIS — J449 Chronic obstructive pulmonary disease, unspecified: Secondary | ICD-10-CM | POA: Diagnosis not present

## 2023-07-15 DIAGNOSIS — I10 Essential (primary) hypertension: Secondary | ICD-10-CM | POA: Diagnosis not present

## 2023-07-15 DIAGNOSIS — L0390 Cellulitis, unspecified: Secondary | ICD-10-CM | POA: Diagnosis not present

## 2023-07-15 DIAGNOSIS — Z299 Encounter for prophylactic measures, unspecified: Secondary | ICD-10-CM | POA: Diagnosis not present

## 2023-07-15 DIAGNOSIS — I700 Atherosclerosis of aorta: Secondary | ICD-10-CM | POA: Diagnosis not present

## 2023-07-27 ENCOUNTER — Other Ambulatory Visit (INDEPENDENT_AMBULATORY_CARE_PROVIDER_SITE_OTHER): Payer: Self-pay | Admitting: Gastroenterology

## 2023-07-28 DIAGNOSIS — I10 Essential (primary) hypertension: Secondary | ICD-10-CM | POA: Diagnosis not present

## 2023-07-28 DIAGNOSIS — Z299 Encounter for prophylactic measures, unspecified: Secondary | ICD-10-CM | POA: Diagnosis not present

## 2023-07-28 DIAGNOSIS — I739 Peripheral vascular disease, unspecified: Secondary | ICD-10-CM | POA: Diagnosis not present

## 2023-07-28 DIAGNOSIS — K219 Gastro-esophageal reflux disease without esophagitis: Secondary | ICD-10-CM | POA: Diagnosis not present

## 2023-07-28 DIAGNOSIS — L0390 Cellulitis, unspecified: Secondary | ICD-10-CM | POA: Diagnosis not present

## 2023-07-28 NOTE — Telephone Encounter (Signed)
This will not be refilled.  Can you please discussed with the daughter if she has scheduled the appointment to be seen by infectious disease?  He needs to be seen by them before we can clear him to start him on the medication for his ulcerative colitis.  Please ask her to schedule appointment as soon as possible.

## 2023-07-29 ENCOUNTER — Telehealth (INDEPENDENT_AMBULATORY_CARE_PROVIDER_SITE_OTHER): Payer: Self-pay

## 2023-07-29 NOTE — Telephone Encounter (Signed)
This Prednisone will not be refilled.  Can you please discussed with the daughter if she has scheduled the appointment to be seen by infectious disease?  He needs to be seen by them before we can clear him to start him on the medication for his ulcerative colitis.  Please ask her to schedule appointment as soon as possible. I spoke with the patient daughter Marvin Lawson and made her aware the prednisone will not be refilled and she will need to reach out to infectious disease to set up an appointment asap as he can not start uc treatment until this is done. Marvin Lawson States understanding.

## 2023-07-29 NOTE — Telephone Encounter (Signed)
This Prednisone will not be refilled.  Can you please discussed with the daughter if she has scheduled the appointment to be seen by infectious disease?  He needs to be seen by them before we can clear him to start him on the medication for his ulcerative colitis.  Please ask her to schedule appointment as soon as possible. I spoke with the patient daughter Marvin Lawson and made her aware the prednisone will not be refilled and she will need to reach out to infectious disease to set up an appointment asap as he can not start uc treatment until this is done. [name] States understanding.

## 2023-08-07 ENCOUNTER — Ambulatory Visit: Payer: 59 | Attending: Internal Medicine

## 2023-08-07 ENCOUNTER — Ambulatory Visit (INDEPENDENT_AMBULATORY_CARE_PROVIDER_SITE_OTHER): Payer: 59 | Admitting: Gastroenterology

## 2023-08-08 ENCOUNTER — Ambulatory Visit: Payer: 59 | Attending: Nurse Practitioner | Admitting: Nurse Practitioner

## 2023-08-08 DIAGNOSIS — I872 Venous insufficiency (chronic) (peripheral): Secondary | ICD-10-CM | POA: Diagnosis not present

## 2023-08-08 DIAGNOSIS — I700 Atherosclerosis of aorta: Secondary | ICD-10-CM | POA: Diagnosis not present

## 2023-08-08 DIAGNOSIS — I739 Peripheral vascular disease, unspecified: Secondary | ICD-10-CM | POA: Diagnosis not present

## 2023-08-08 DIAGNOSIS — I480 Paroxysmal atrial fibrillation: Secondary | ICD-10-CM | POA: Diagnosis not present

## 2023-08-08 DIAGNOSIS — D692 Other nonthrombocytopenic purpura: Secondary | ICD-10-CM | POA: Diagnosis not present

## 2023-09-25 ENCOUNTER — Ambulatory Visit (INDEPENDENT_AMBULATORY_CARE_PROVIDER_SITE_OTHER): Payer: 59 | Admitting: Gastroenterology

## 2023-10-28 ENCOUNTER — Ambulatory Visit (INDEPENDENT_AMBULATORY_CARE_PROVIDER_SITE_OTHER): Payer: 59 | Admitting: Gastroenterology

## 2023-11-03 DIAGNOSIS — Z0000 Encounter for general adult medical examination without abnormal findings: Secondary | ICD-10-CM | POA: Diagnosis not present

## 2023-11-03 DIAGNOSIS — K5120 Ulcerative (chronic) proctitis without complications: Secondary | ICD-10-CM | POA: Diagnosis not present

## 2023-11-03 DIAGNOSIS — I10 Essential (primary) hypertension: Secondary | ICD-10-CM | POA: Diagnosis not present

## 2023-11-03 DIAGNOSIS — I480 Paroxysmal atrial fibrillation: Secondary | ICD-10-CM | POA: Diagnosis not present

## 2023-11-03 DIAGNOSIS — I739 Peripheral vascular disease, unspecified: Secondary | ICD-10-CM | POA: Diagnosis not present

## 2023-11-03 DIAGNOSIS — Z299 Encounter for prophylactic measures, unspecified: Secondary | ICD-10-CM | POA: Diagnosis not present

## 2023-11-27 ENCOUNTER — Ambulatory Visit (INDEPENDENT_AMBULATORY_CARE_PROVIDER_SITE_OTHER): Payer: 59 | Admitting: Gastroenterology

## 2024-07-07 DIAGNOSIS — Z23 Encounter for immunization: Secondary | ICD-10-CM | POA: Diagnosis not present

## 2024-07-28 ENCOUNTER — Encounter (INDEPENDENT_AMBULATORY_CARE_PROVIDER_SITE_OTHER): Payer: Self-pay | Admitting: Gastroenterology
